# Patient Record
Sex: Male | Born: 1975 | Race: White | Hispanic: No | Marital: Married | Smoking: Never smoker
Health system: Southern US, Community
[De-identification: ages and names within clinical notes are randomized; demographics above are authoritative.]

## PROBLEM LIST (undated history)

## (undated) DIAGNOSIS — E079 Disorder of thyroid, unspecified: Secondary | ICD-10-CM

## (undated) DIAGNOSIS — B019 Varicella without complication: Secondary | ICD-10-CM

## (undated) DIAGNOSIS — G43909 Migraine, unspecified, not intractable, without status migrainosus: Secondary | ICD-10-CM

## (undated) DIAGNOSIS — R519 Headache, unspecified: Secondary | ICD-10-CM

## (undated) DIAGNOSIS — M109 Gout, unspecified: Secondary | ICD-10-CM

## (undated) DIAGNOSIS — I10 Essential (primary) hypertension: Secondary | ICD-10-CM

## (undated) DIAGNOSIS — T7840XA Allergy, unspecified, initial encounter: Secondary | ICD-10-CM

## (undated) DIAGNOSIS — M1990 Unspecified osteoarthritis, unspecified site: Secondary | ICD-10-CM

## (undated) HISTORY — DX: Headache, unspecified: R51.9

## (undated) HISTORY — DX: Allergy, unspecified, initial encounter: T78.40XA

## (undated) HISTORY — DX: Unspecified osteoarthritis, unspecified site: M19.90

## (undated) HISTORY — DX: Varicella without complication: B01.9

## (undated) HISTORY — DX: Essential (primary) hypertension: I10

## (undated) HISTORY — DX: Disorder of thyroid, unspecified: E07.9

## (undated) HISTORY — DX: Migraine, unspecified, not intractable, without status migrainosus: G43.909

---
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
## 2018-11-15 ENCOUNTER — Encounter: Payer: Self-pay | Admitting: Primary Care

## 2018-11-15 ENCOUNTER — Other Ambulatory Visit: Payer: Self-pay

## 2018-11-15 ENCOUNTER — Ambulatory Visit: Payer: PRIVATE HEALTH INSURANCE | Admitting: Primary Care

## 2018-11-15 ENCOUNTER — Other Ambulatory Visit: Payer: Self-pay | Admitting: Primary Care

## 2018-11-15 VITALS — BP 182/118 | HR 82 | Temp 98.4°F | Ht 66.75 in | Wt 282.8 lb

## 2018-11-15 DIAGNOSIS — I10 Essential (primary) hypertension: Secondary | ICD-10-CM

## 2018-11-15 DIAGNOSIS — M5136 Other intervertebral disc degeneration, lumbar region: Secondary | ICD-10-CM | POA: Diagnosis not present

## 2018-11-15 DIAGNOSIS — E039 Hypothyroidism, unspecified: Secondary | ICD-10-CM | POA: Diagnosis not present

## 2018-11-15 LAB — COMPREHENSIVE METABOLIC PANEL
ALT: 21 U/L (ref 0–53)
AST: 16 U/L (ref 0–37)
Albumin: 4.9 g/dL (ref 3.5–5.2)
Alkaline Phosphatase: 77 U/L (ref 39–117)
BUN: 13 mg/dL (ref 6–23)
CO2: 30 mEq/L (ref 19–32)
Calcium: 9.6 mg/dL (ref 8.4–10.5)
Chloride: 100 mEq/L (ref 96–112)
Creatinine, Ser: 0.8 mg/dL (ref 0.40–1.50)
GFR: 105.96 mL/min (ref >60.00)
Glucose, Bld: 92 mg/dL (ref 70–99)
Potassium: 4.1 mEq/L (ref 3.5–5.1)
Sodium: 139 mEq/L (ref 135–145)
Total Bilirubin: 0.5 mg/dL (ref 0.2–1.2)
Total Protein: 7.3 g/dL (ref 6.0–8.3)

## 2018-11-15 LAB — HEMOGLOBIN A1C: Hgb A1c MFr Bld: 5.8 % (ref 4.6–6.5)

## 2018-11-15 LAB — TSH: TSH: 10.31 u[IU]/mL — ABNORMAL HIGH (ref 0.35–4.50)

## 2018-11-15 LAB — T4, FREE: Free T4: 0.6 ng/dL (ref 0.60–1.60)

## 2018-11-15 MED ORDER — LEVOTHYROXINE SODIUM 50 MCG PO TABS: ORAL_TABLET | ORAL | 1 refills | Status: DC

## 2018-11-15 MED ORDER — LISINOPRIL-HYDROCHLOROTHIAZIDE 20-12.5 MG PO TABS: 1.0000 | ORAL_TABLET | Freq: Every day | ORAL | 0 refills | Status: DC

## 2018-11-15 NOTE — Patient Instructions (Signed)
Start lisinopril-hydrochlorothiazide 20-12.5 mg once daily for blood pressure.   Stop by the lab prior to leaving today. I will notify you of your results once received.   Start monitoring your blood pressure daily, around the same time of day, for the next 2-3 weeks.  Ensure that you have rested for 30 minutes prior to checking your blood pressure. Record your readings and bring them to your next visit.  Schedule a follow up visit with me for either August 31st and September 1st.  It was a pleasure to meet you today! Please don't hesitate to call or message me with any questions. Welcome to [facility]!   Hypertension, Adult High blood pressure (hypertension) is when the force of blood pumping through the arteries is too strong. The arteries are the blood vessels that carry blood from the heart throughout the body. Hypertension forces the heart to work harder to pump blood and may cause arteries to become narrow or stiff. Untreated or uncontrolled hypertension can cause a heart attack, heart failure, a stroke, kidney disease, and other problems. A blood pressure reading consists of a higher number over a lower number. Ideally, your blood pressure should be below 120/80. The first ("top") number is called the systolic pressure. It is a measure of the pressure in your arteries as your heart beats. The second ("bottom") number is called the diastolic pressure. It is a measure of the pressure in your arteries as the heart relaxes. What are the causes? The exact cause of this condition is not known. There are some conditions that result in or are related to high blood pressure. What increases the risk? Some risk factors for high blood pressure are under your control. The following factors may make you more likely to develop this condition:  Smoking.  Having type 2 diabetes mellitus, high cholesterol, or both.  Not getting enough exercise or physical activity.  Being overweight.  Having too much  fat, sugar, calories, or salt (sodium) in your diet.  Drinking too much alcohol. Some risk factors for high blood pressure may be difficult or impossible to change. Some of these factors include:  Having chronic kidney disease.  Having a family history of high blood pressure.  Age. Risk increases with age.  Race. You may be at higher risk if you are African American.  Gender. Men are at higher risk than women before age 42. After age 42, women are at higher risk than men.  Having obstructive sleep apnea.  Stress. What are the signs or symptoms? High blood pressure may not cause symptoms. Very high blood pressure (hypertensive crisis) may cause:  Headache.  Anxiety.  Shortness of breath.  Nosebleed.  Nausea and vomiting.  Vision changes.  Severe chest pain.  Seizures. How is this diagnosed? This condition is diagnosed by measuring your blood pressure while you are seated, with your arm resting on a flat surface, your legs uncrossed, and your feet flat on the floor. The cuff of the blood pressure monitor will be placed directly against the skin of your upper arm at the level of your heart. It should be measured at least twice using the same arm. Certain conditions can cause a difference in blood pressure between your right and left arms. Certain factors can cause blood pressure readings to be lower or higher than normal for a short period of time:  When your blood pressure is higher when you are in a health care provider's office than when you are at home, this is  called white coat hypertension. Most people with this condition do not need medicines.  When your blood pressure is higher at home than when you are in a health care provider's office, this is called masked hypertension. Most people with this condition may need medicines to control blood pressure. If you have a high blood pressure reading during one visit or you have normal blood pressure with other risk factors,  you may be asked to:  Return on a different day to have your blood pressure checked again.  Monitor your blood pressure at home for 1 week or longer. If you are diagnosed with hypertension, you may have other blood or imaging tests to help your health care provider understand your overall risk for other conditions. How is this treated? This condition is treated by making healthy lifestyle changes, such as eating healthy foods, exercising more, and reducing your alcohol intake. Your health care provider may prescribe medicine if lifestyle changes are not enough to get your blood pressure under control, and if:  Your systolic blood pressure is above 130.  Your diastolic blood pressure is above 80. Your personal target blood pressure may vary depending on your medical conditions, your age, and other factors. Follow these instructions at home: Eating and drinking   Eat a diet that is high in fiber and potassium, and low in sodium, added sugar, and fat. An example eating plan is called the DASH (Dietary Approaches to Stop Hypertension) diet. To eat this way: ? Eat plenty of fresh fruits and vegetables. Try to fill one half of your plate at each meal with fruits and vegetables. ? Eat whole grains, such as whole-wheat pasta, brown rice, or whole-grain bread. Fill about one fourth of your plate with whole grains. ? Eat or drink low-fat dairy products, such as skim milk or low-fat yogurt. ? Avoid fatty cuts of meat, processed or cured meats, and poultry with skin. Fill about one fourth of your plate with lean proteins, such as fish, chicken without skin, beans, eggs, or tofu. ? Avoid pre-made and processed foods. These tend to be higher in sodium, added sugar, and fat.  Reduce your daily sodium intake. Most people with hypertension should eat less than 1,500 mg of sodium a day.  Do not drink alcohol if: ? Your health care provider tells you not to drink. ? You are pregnant, may be pregnant, or are  planning to become pregnant.  If you drink alcohol: ? Limit how much you use to:  0-1 drink a day for women.  0-2 drinks a day for men. ? Be aware of how much alcohol is in your drink. In the [place] one drink equals one 12 oz bottle of beer (355 mL), one 5 oz glass of wine (148 mL), or one 1 oz glass of hard liquor (44 mL). Lifestyle   Work with your health care provider to maintain a healthy body weight or to lose weight. Ask what an ideal weight is for you.  Get at least 30 minutes of exercise most days of the week. Activities may include walking, swimming, or biking.  Include exercise to strengthen your muscles (resistance exercise), such as Pilates or lifting weights, as part of your weekly exercise routine. Try to do these types of exercises for 30 minutes at least 3 days a week.  Do not use any products that contain nicotine or tobacco, such as cigarettes, e-cigarettes, and chewing tobacco. If you need help quitting, ask your health care provider.  Monitor your  blood pressure at home as told by your health care provider.  Keep all follow-up visits as told by your health care provider. This is important. Medicines  Take over-the-counter and prescription medicines only as told by your health care provider. Follow directions carefully. Blood pressure medicines must be taken as prescribed.  Do not skip doses of blood pressure medicine. Doing this puts you at risk for problems and can make the medicine less effective.  Ask your health care provider about side effects or reactions to medicines that you should watch for. Contact a health care provider if you:  Think you are having a reaction to a medicine you are taking.  Have headaches that keep coming back (recurring).  Feel dizzy.  Have swelling in your ankles.  Have trouble with your vision. Get help right away if you:  Develop a severe headache or confusion.  Have unusual weakness or numbness.  Feel faint.  Have  severe pain in your chest or abdomen.  Vomit repeatedly.  Have trouble breathing. Summary  Hypertension is when the force of blood pumping through your arteries is too strong. If this condition is not controlled, it may put you at risk for serious complications.  Your personal target blood pressure may vary depending on your medical conditions, your age, and other factors. For most people, a normal blood pressure is less than 120/80.  Hypertension is treated with lifestyle changes, medicines, or a combination of both. Lifestyle changes include losing weight, eating a healthy, low-sodium diet, exercising more, and limiting alcohol. This information is not intended to replace advice given to you by your health care provider. Make sure you discuss any questions you have with your health care provider. Document Released: 03/15/2005 Document Revised: 11/23/2017 Document Reviewed: 11/23/2017 Elsevier Patient Education  2020 [employer]

## 2018-11-15 NOTE — Assessment & Plan Note (Signed)
Diagnosed years ago, has been off treatment for numerous years.  TSH today of 10, free T4 of 0.60. Prescription for levothyroxine 50 mcg sent to pharmacy.  Discussed proper instructions for taking levothyroxine.  Repeat labs in 6 weeks.

## 2018-11-15 NOTE — Assessment & Plan Note (Signed)
Significantly above goal on today's visit, also during visit with neurosurgery last week.  Prescription for lisinopril-chlorothiazide 20-12.5mg  sent to pharmacy.  We will have him monitor his readings at home, we will also plan to see him back in 2 weeks for follow-up and BMP.

## 2018-11-15 NOTE — Progress Notes (Signed)
Subjective:    Patient ID: Peter Fitzgerald, male    DOB: [birth year], 43 y.o.   MRN: [id number]  HPI  Peter Fitzgerald is a 43 year old male who presents today to establish care and discuss the problems mentioned below. Will obtain/review records.  1) Essential Hypertension: Sent to our office from Peter Fitzgerald for uncontrolled blood pressure with a reading of 173/122 during a recent visit on 11/08/18. He's not seen a PCP since 2013, endorses hypertension diagnosis at that time and was treated with medication and does not recall  He denies headaches, dizziness, chest pain.   BP Readings from Last 3 Encounters:  11/15/18 (!) 182/118   2) Thyroid Problems: Diagnosed about 10+ years ago, once managed on levothyroxine and his last dose was 150 mcg. He has been off of levothyroxine for several years.  No recent thyroid check.  He denies extreme fatigue, cold intolerance, constipation, brittle nails.   3) Migraines: Chronic for years, nearly resolved now. Last migraine was 2-3 weeks ago, prior to then was months ago. Previously managed on Relpax for which was used as needed.   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  Recent MRI with significant degenerative disc disease, nerve root compression.  Plan is for microdiscectomy and laminectomy per Peter Fitzgerald.  Prior to surgery he must gain better control over uncontrolled hypertension.  Tentatively he is looking at surgery for 6 to 8 weeks from now.  Review of Systems  Constitutional: Negative for fatigue.  Eyes: Negative for visual disturbance.  Respiratory: Negative for shortness of breath.   Cardiovascular: Negative for chest pain and palpitations.  Endocrine: Negative for cold intolerance.  Musculoskeletal: Positive for back pain.  Neurological: Negative for dizziness and headaches.       Past Medical History:  Diagnosis Date  . Allergy   . Arthritis   . Chickenpox   . Frequent headaches   . Hypertension   .  Migraines   . Thyroid disease      Social History   Socioeconomic History  . Marital status: Married    Spouse name: Not on file  . Number of children: Not on file  . Years of education: Not on file  . Highest education level: Not on file  Occupational History  . Not on file  Social Needs  . Financial resource strain: Not on file  . Food insecurity    Worry: Not on file    Inability: Not on file  . Transportation needs    Medical: Not on file    Non-medical: Not on file  Tobacco Use  . Smoking status: Never Smoker  . Smokeless tobacco: Never Used  Substance and Sexual Activity  . Alcohol use: Not Currently  . Drug use: Not on file  . Sexual activity: Not on file  Lifestyle  . Physical activity    Days per week: Not on file    Minutes per session: Not on file  . Stress: Not on file  Relationships  . Social Herbalist on phone: Not on file    Gets together: Not on file    Attends religious service: Not on file    Active member of club or organization: Not on file    Attends meetings of clubs or organizations: Not on file    Relationship status: Not on file  . Intimate partner violence    Fear of current or ex partner: Not on file    Emotionally abused:  Not on file    Physically abused: Not on file    Forced sexual activity: Not on file  Other Topics Concern  . Not on file  Social History Narrative   Married.   One child.   Works as a Engineer, [employer].    Past Surgical History:  Procedure Laterality Date  . MINOR CARPAL TUNNEL  1997    Family History  Problem Relation Age of Onset  . Alcohol abuse Mother   . Arthritis Mother   . COPD Mother   . Drug abuse Mother   . Early death Mother        untreated COPD, CKD  . Kidney disease Mother        On dialysis   . Learning disabilities Mother   . Alcohol abuse Father   . Drug abuse Father   . Early death Father        Drowning    No Known Allergies  Current Outpatient Medications on File Prior to  Visit  Medication Sig Dispense Refill  . cetirizine (ZYRTEC) 10 MG tablet Take 10 mg by mouth daily.    Marland Kitchen. ibuprofen (ADVIL) 800 MG tablet Take 800 mg by mouth 2 (two) times daily.     No current facility-administered medications on file prior to visit.     BP (!) 182/118   Pulse 82   Temp 98.4 F (36.9 C) (Temporal)   Ht 5' 6.75" (1.695 m)   Wt 282 lb 12 oz (128.3 kg)   SpO2 97%   BMI 44.62 kg/m    Objective:   Physical Exam  Constitutional: He appears well-nourished.  Neck: Neck supple.  Cardiovascular: Normal rate and regular rhythm.  Respiratory: Effort normal and breath sounds normal.  Musculoskeletal:     Comments: Patient standing during visit due to chronic back pain.  Skin: Skin is warm and dry.  Psychiatric: He has a normal mood and affect.           Assessment & Plan:

## 2018-11-15 NOTE — Assessment & Plan Note (Signed)
Following with neurosurgery, plans for surgery within the next 6 to 8 weeks.  Will gain control of blood pressure so that he can be cleared for surgery.

## 2018-11-27 ENCOUNTER — Encounter: Payer: Self-pay | Admitting: Primary Care

## 2018-11-27 ENCOUNTER — Other Ambulatory Visit: Payer: Self-pay

## 2018-11-27 ENCOUNTER — Ambulatory Visit: Payer: PRIVATE HEALTH INSURANCE | Admitting: Primary Care

## 2018-11-27 VITALS — BP 122/82 | HR 78 | Temp 97.9°F | Ht 66.75 in | Wt 278.5 lb

## 2018-11-27 DIAGNOSIS — I10 Essential (primary) hypertension: Secondary | ICD-10-CM

## 2018-11-27 DIAGNOSIS — E039 Hypothyroidism, unspecified: Secondary | ICD-10-CM

## 2018-11-27 LAB — BASIC METABOLIC PANEL
BUN: 20 mg/dL (ref 6–23)
CO2: 30 mEq/L (ref 19–32)
Calcium: 9.5 mg/dL (ref 8.4–10.5)
Chloride: 98 mEq/L (ref 96–112)
Creatinine, Ser: 0.97 mg/dL (ref 0.40–1.50)
GFR: 84.82 mL/min (ref >60.00)
Glucose, Bld: 98 mg/dL (ref 70–99)
Potassium: 4.1 mEq/L (ref 3.5–5.1)
Sodium: 137 mEq/L (ref 135–145)

## 2018-11-27 MED ORDER — LISINOPRIL-HYDROCHLOROTHIAZIDE 20-12.5 MG PO TABS: 1.0000 | ORAL_TABLET | Freq: Every day | ORAL | 3 refills | Status: DC

## 2018-11-27 NOTE — Patient Instructions (Signed)
Stop by the lab prior to leaving today. I will notify you of your results once received.   Continue lisinopril-HCTZ 20-12.5 mg once daily for blood pressure.  Be sure to take your levothyroxine (thyroid medication) every morning on an empty stomach with water only. No food or other medications for 30 minutes. No heartburn medication, iron pills, calcium, vitamin D, or magnesium pills within four hours of taking levothyroxine.   Schedule a lab only appointment for 4 weeks to repeat thyroid testing.   It was a pleasure to see you today!

## 2018-11-27 NOTE — Progress Notes (Signed)
Subjective:    Patient ID: Peter Fitzgerald, male    DOB: 02/02/1976, 43 y.o.   MRN: [id number]  HPI  Peter Fitzgerald is a 43 year old male who presents today for follow up of hypertension and hypothyroidism.  1) Essential Hypertension: He was last evaluated as a new patient on 11/15/18, sent by neurosurgery due to uncontrolled hypertension. He is pending surgery for degenerative disc disease to lumbar spine. Given elevated readings he was initiated on lisinopril-HCTZ 20-12.5 mg and asked to return two weeks later for BP check.  BP Readings from Last 3 Encounters:  11/27/18 122/82  11/15/18 (!) 182/118   Since his last visit he's checking his BP at home and is getting readings of 150-170's/90's-low 100's. He has an older BP cuff and thinks his cuff is faulty. He had a mild cough initially which has since improved. He denies headaches, dizziness.   2) Hypothyroidism: Diagnosed years ago. During his last visit as a new patient he endorsed being off of thyroid treatment for numerous years. TSH of 10 and Free T4 of 0.60 that day so he was initiated on levothyroxine 50 mcg.   Since his last visit he is taking his levothyroxine every morning with water only, no food or other medications for at least 30 minutes.   Review of Systems  Respiratory: Negative for cough and shortness of breath.   Cardiovascular: Negative for chest pain.  Neurological: Negative for dizziness and headaches.       Past Medical History:  Diagnosis Date  . Allergy   . Arthritis   . Chickenpox   . Frequent headaches   . Hypertension   . Migraines   . Thyroid disease      Social History   Socioeconomic History  . Marital status: Married    Spouse name: Not on file  . Number of children: Not on file  . Years of education: Not on file  . Highest education level: Not on file  Occupational History  . Not on file  Social Needs  . Financial resource strain: Not on file  . Food insecurity    Worry: Not on file   Inability: Not on file  . Transportation needs    Medical: Not on file    Non-medical: Not on file  Tobacco Use  . Smoking status: Never Smoker  . Smokeless tobacco: Never Used  Substance and Sexual Activity  . Alcohol use: Not Currently  . Drug use: Not on file  . Sexual activity: Not on file  Lifestyle  . Physical activity    Days per week: Not on file    Minutes per session: Not on file  . Stress: Not on file  Relationships  . Social Herbalist on phone: Not on file    Gets together: Not on file    Attends religious service: Not on file    Active member of club or organization: Not on file    Attends meetings of clubs or organizations: Not on file    Relationship status: Not on file  . Intimate partner violence    Fear of current or ex partner: Not on file    Emotionally abused: Not on file    Physically abused: Not on file    Forced sexual activity: Not on file  Other Topics Concern  . Not on file  Social History Narrative   Married.   One child.   Works as a Child psychotherapist.    Past Surgical  History:  Procedure Laterality Date  . MINOR CARPAL TUNNEL  1997    Family History  Problem Relation Age of Onset  . Alcohol abuse Mother   . Arthritis Mother   . COPD Mother   . Drug abuse Mother   . Early death Mother        untreated COPD, CKD  . Kidney disease Mother        On dialysis   . Learning disabilities Mother   . Alcohol abuse Father   . Drug abuse Father   . Early death Father        Drowning    No Known Allergies  Current Outpatient Medications on File Prior to Visit  Medication Sig Dispense Refill  . cetirizine (ZYRTEC) 10 MG tablet Take 10 mg by mouth daily.    Marland Kitchen. ibuprofen (ADVIL) 800 MG tablet Take 800 mg by mouth 2 (two) times daily.    Marland Kitchen. levothyroxine (SYNTHROID) 50 MCG tablet Take 1 tablet by mouth every morning on an empty stomach with water only.  No food or other medications for 30 minutes. 30 tablet 1  . lisinopril-hydrochlorothiazide  (ZESTORETIC) 20-12.5 MG tablet Take 1 tablet by mouth daily. For blood pressure. 30 tablet 0   No current facility-administered medications on file prior to visit.     BP 122/82   Pulse 78   Temp 97.9 F (36.6 C) (Oral)   Ht 5' 6.75" (1.695 m)   Wt 278 lb 8 oz (126.3 kg)   SpO2 97%   BMI 43.95 kg/m    Objective:   Physical Exam  Constitutional: He appears well-nourished.  Neck: Neck supple.  Cardiovascular: Normal rate and regular rhythm.  Respiratory: Effort normal and breath sounds normal.  Skin: Skin is warm and dry.  Psychiatric: He has a normal mood and affect.           Assessment & Plan:

## 2018-11-27 NOTE — Assessment & Plan Note (Signed)
Improved with prescribed regimen in our office, home readings are higher but I question the accuracy of his cuff. Manual reading in our office proves to be controlled.   BMP unremarkable, continue current regimen. Refills sent to pharmacy.

## 2018-11-27 NOTE — Assessment & Plan Note (Signed)
Taking levothyroxine appropriately.  Repeat TSH in 4 weeks.

## 2018-12-18 ENCOUNTER — Other Ambulatory Visit: Payer: Self-pay | Admitting: Primary Care

## 2018-12-18 DIAGNOSIS — E039 Hypothyroidism, unspecified: Secondary | ICD-10-CM

## 2018-12-25 ENCOUNTER — Other Ambulatory Visit (INDEPENDENT_AMBULATORY_CARE_PROVIDER_SITE_OTHER): Payer: PRIVATE HEALTH INSURANCE

## 2018-12-25 ENCOUNTER — Other Ambulatory Visit: Payer: Self-pay

## 2018-12-25 DIAGNOSIS — E039 Hypothyroidism, unspecified: Secondary | ICD-10-CM | POA: Diagnosis not present

## 2018-12-25 LAB — TSH: TSH: 3.63 u[IU]/mL (ref 0.35–4.50)

## 2018-12-28 HISTORY — PX: LAMINECTOMY AND MICRODISCECTOMY LUMBAR SPINE: SHX1913

## 2019-01-10 ENCOUNTER — Other Ambulatory Visit: Payer: Self-pay

## 2019-01-10 DIAGNOSIS — Z20822 Contact with and (suspected) exposure to covid-19: Secondary | ICD-10-CM

## 2019-01-12 LAB — NOVEL CORONAVIRUS, NAA: SARS-CoV-2, NAA: NOT DETECTED

## 2019-01-18 ENCOUNTER — Other Ambulatory Visit: Payer: Self-pay | Admitting: Primary Care

## 2019-01-18 DIAGNOSIS — E039 Hypothyroidism, unspecified: Secondary | ICD-10-CM

## 2019-01-19 ENCOUNTER — Ambulatory Visit (INDEPENDENT_AMBULATORY_CARE_PROVIDER_SITE_OTHER): Payer: PRIVATE HEALTH INSURANCE | Admitting: Primary Care

## 2019-01-19 DIAGNOSIS — R05 Cough: Secondary | ICD-10-CM | POA: Diagnosis not present

## 2019-01-19 DIAGNOSIS — R059 Cough, unspecified: Secondary | ICD-10-CM

## 2019-01-19 DIAGNOSIS — J302 Other seasonal allergic rhinitis: Secondary | ICD-10-CM

## 2019-01-19 MED ORDER — OMEPRAZOLE 20 MG PO CPDR: 20.0000 mg | DELAYED_RELEASE_CAPSULE | Freq: Every evening | ORAL | 0 refills | Status: DC

## 2019-01-19 MED ORDER — LEVOCETIRIZINE DIHYDROCHLORIDE 5 MG PO TABS: 5.0000 mg | ORAL_TABLET | Freq: Every evening | ORAL | 0 refills | Status: DC

## 2019-01-19 NOTE — Patient Instructions (Signed)
Stop Zyrtec. Start levocetirizine (Xyzal) for allergies. Take at bedtime.  Start omeprazole 20 mg at bedtime in a few days if no improvement.  The endotracheal tube from intubation during your surgery could have caused aggravation, this should improve overtime.   Please update me in one week. Call me if you develop fevers, chills, fatigue, start feeling bad.  It was a pleasure to see you today! Allie Bossier, NP-C

## 2019-01-19 NOTE — Progress Notes (Signed)
Subjective:    Patient ID: Peter Fitzgerald, male    DOB: 1975/11/06, 42 y.o.   MRN: [id number]  HPI  Virtual Visit via Video Note  I connected with Peter Fitzgerald on 01/19/19 at  8:40 AM EDT by a video enabled telemedicine application and verified that I am speaking with the correct person using two identifiers.  Location: Patient: Home Provider: Office   I discussed the limitations of evaluation and management by telemedicine and the availability of in person appointments. The patient expressed understanding and agreed to proceed.  History of Present Illness:  Peter Fitzgerald is a 43 year old male with a history of hypertension managed on ACE, hypothyroidism who presents today with a chief complaint of cough.  He also has a scratchy throat and headache at times. His cough began a few days prior to his back surgery on 01/06/19.Marland Kitchen He initially thought his cough was related to season allergies as he does have post nasal drip. He was tested for Covid-19 on 01/10/19 which was negative. He was compliant to his incentive spirometer as directed.   He denies fevers, rhinorrhea, nasal congestion, sinus pressure, esophageal burning, fatigue. He feels great overall. Cough is worse in the afternoon/evening, not bothersome during sleep but will wake at night noticing post nasal drip.  He is taking Zyrtec daily.    Observations/Objective:  Alert and oriented. Appears well, not sickly. No distress. Speaking in complete sentences. No cough during visit.  Assessment and Plan:  Acute cough x 2 weeks, feels well. Differentials include ACE induced cough, silent reflux aggravated by post nasal drip, allergy induced from post nasal drip. He appears well and feels well so I doubt viral/bacterial involvement. Negative Covid-19 test on 01/10/19. Cough began prior to surgery but endotracheal tube could have caused irritation.   Will have him switch from Zyrtec to [place] in omeprazole 20 mg nightly after a few  days if no improvement. He will update in one week. Consider holding ACE if needed.  Follow Up Instructions:  Stop Zyrtec. Start levocetirizine (Xyzal) for allergies. Take at bedtime.  Start omeprazole 20 mg at bedtime in a few days if no improvement.  The endotracheal tube from intubation during your surgery could have caused aggravation, this should improve overtime.   Please update me in one week. Call me if you develop fevers, chills, fatigue, start feeling bad.  It was a pleasure to see you today! Allie Bossier, NP-C    I discussed the assessment and treatment plan with the patient. The patient was provided an opportunity to ask questions and all were answered. The patient agreed with the plan and demonstrated an understanding of the instructions.   The patient was advised to call back or seek an in-person evaluation if the symptoms worsen or if the condition fails to improve as anticipated.   Pleas Koch, NP    Review of Systems  Constitutional: Negative for chills, fatigue and fever.  HENT: Positive for postnasal drip and sore throat. Negative for congestion, ear pain, rhinorrhea and sinus pressure.   Respiratory: Positive for cough. Negative for shortness of breath.   Cardiovascular: Negative for chest pain.  Allergic/Immunologic: Positive for environmental allergies.       Past Medical History:  Diagnosis Date  . Allergy   . Arthritis   . Chickenpox   . Frequent headaches   . Hypertension   . Migraines   . Thyroid disease      Social History   Socioeconomic History  .  Marital status: Married    Spouse name: Not on file  . Number of children: Not on file  . Years of education: Not on file  . Highest education level: Not on file  Occupational History  . Not on file  Social Needs  . Financial resource strain: Not on file  . Food insecurity    Worry: Not on file    Inability: Not on file  . Transportation needs    Medical: Not on file     Non-medical: Not on file  Tobacco Use  . Smoking status: Never Smoker  . Smokeless tobacco: Never Used  Substance and Sexual Activity  . Alcohol use: Not Currently  . Drug use: Not on file  . Sexual activity: Not on file  Lifestyle  . Physical activity    Days per week: Not on file    Minutes per session: Not on file  . Stress: Not on file  Relationships  . Social Musician on phone: Not on file    Gets together: Not on file    Attends religious service: Not on file    Active member of club or organization: Not on file    Attends meetings of clubs or organizations: Not on file    Relationship status: Not on file  . Intimate partner violence    Fear of current or ex partner: Not on file    Emotionally abused: Not on file    Physically abused: Not on file    Forced sexual activity: Not on file  Other Topics Concern  . Not on file  Social History Narrative   Married.   One child.   Works as a Engineer, production.    Past Surgical History:  Procedure Laterality Date  . MINOR CARPAL TUNNEL  1997    Family History  Problem Relation Age of Onset  . Alcohol abuse Mother   . Arthritis Mother   . COPD Mother   . Drug abuse Mother   . Early death Mother        untreated COPD, CKD  . Kidney disease Mother        On dialysis   . Learning disabilities Mother   . Alcohol abuse Father   . Drug abuse Father   . Early death Father        Drowning    No Known Allergies  Current Outpatient Medications on File Prior to Visit  Medication Sig Dispense Refill  . cetirizine (ZYRTEC) 10 MG tablet Take 10 mg by mouth daily.    Marland Kitchen ibuprofen (ADVIL) 800 MG tablet Take 800 mg by mouth 2 (two) times daily.    Marland Kitchen levothyroxine (SYNTHROID) 50 MCG tablet TAKE 1 TABLET BY MOUTH EVERY MORNING ON AN EMPTY STOMACH WITH WATER ONLY. NO FOOD/MEDS FOR 30 MINS 90 tablet 1  . lisinopril-hydrochlorothiazide (ZESTORETIC) 20-12.5 MG tablet Take 1 tablet by mouth daily. For blood pressure. 90 tablet 3    No current facility-administered medications on file prior to visit.     There were no vitals taken for this visit.   Objective:   Physical Exam  Constitutional: He is oriented to person, place, and time. He appears well-nourished.  Respiratory: Effort normal. No respiratory distress.  No cough during exam  Neurological: He is alert and oriented to person, place, and time.           Assessment & Plan:

## 2019-03-11 ENCOUNTER — Other Ambulatory Visit: Payer: Self-pay | Admitting: Primary Care

## 2019-03-11 DIAGNOSIS — R05 Cough: Secondary | ICD-10-CM

## 2019-03-11 DIAGNOSIS — R059 Cough, unspecified: Secondary | ICD-10-CM

## 2019-03-14 NOTE — Telephone Encounter (Signed)
See my chart message

## 2019-03-14 NOTE — Telephone Encounter (Signed)
Last prescribed on 01/19/2019 . Last appointment on 01/19/2019. No future appointment

## 2019-04-23 ENCOUNTER — Ambulatory Visit: Payer: PRIVATE HEALTH INSURANCE | Attending: Internal Medicine

## 2019-04-23 ENCOUNTER — Other Ambulatory Visit: Payer: PRIVATE HEALTH INSURANCE

## 2019-04-23 DIAGNOSIS — Z20822 Contact with and (suspected) exposure to covid-19: Secondary | ICD-10-CM

## 2019-04-24 LAB — NOVEL CORONAVIRUS, NAA: SARS-CoV-2, NAA: DETECTED — AB

## 2019-04-25 ENCOUNTER — Encounter: Payer: Self-pay | Admitting: Nurse Practitioner

## 2019-04-25 ENCOUNTER — Telehealth (HOSPITAL_COMMUNITY): Payer: Self-pay | Admitting: Nurse Practitioner

## 2019-04-25 NOTE — Telephone Encounter (Signed)
Called to Discuss with patient about Covid symptoms and the use of bamlanivimab, a monoclonal antibody infusion for those with mild to moderate Covid symptoms and at a high risk of hospitalization.     Pt is qualified for this infusion at the [facility] infusion center due to co-morbid conditions and/or a member of an at-risk group.     Unable to reach pt. Sent [employer]. Left phone message.

## 2019-06-07 ENCOUNTER — Ambulatory Visit (INDEPENDENT_AMBULATORY_CARE_PROVIDER_SITE_OTHER): Payer: PRIVATE HEALTH INSURANCE | Admitting: Primary Care

## 2019-06-07 ENCOUNTER — Other Ambulatory Visit: Payer: Self-pay

## 2019-06-07 ENCOUNTER — Ambulatory Visit (INDEPENDENT_AMBULATORY_CARE_PROVIDER_SITE_OTHER)
Admission: RE | Admit: 2019-06-07 | Discharge: 2019-06-07 | Disposition: A | Discharge status: Home / Self Care | Payer: PRIVATE HEALTH INSURANCE | Source: Ambulatory Visit | Attending: Primary Care | Admitting: Primary Care

## 2019-06-07 VITALS — BP 122/80 | HR 82 | Temp 97.2°F | Ht 66.75 in | Wt 280.8 lb

## 2019-06-07 DIAGNOSIS — I10 Essential (primary) hypertension: Secondary | ICD-10-CM

## 2019-06-07 DIAGNOSIS — G8929 Other chronic pain: Secondary | ICD-10-CM

## 2019-06-07 DIAGNOSIS — M546 Pain in thoracic spine: Secondary | ICD-10-CM | POA: Diagnosis not present

## 2019-06-07 MED ORDER — LOSARTAN POTASSIUM-HCTZ 50-12.5 MG PO TABS: 1.0000 | ORAL_TABLET | Freq: Every day | ORAL | 0 refills | Status: DC

## 2019-06-07 NOTE — Assessment & Plan Note (Signed)
Well controlled on current regimen, does seem to have ACE-I induced cough.  Stop lisinopril-HCTZ. Start losartan-HCTZ.  He will monitor BP at home and will update in 2 weeks via My Chart.

## 2019-06-07 NOTE — Patient Instructions (Addendum)
Stop taking lisinopril-HCTZ for blood pressure.  Start taking losartan-HCTZ for blood pressure.  Please update me in 2 weeks with blood pressure readings and your cough.  Complete xray(s) prior to leaving today. I will notify you of your results once received.  It was a pleasure to see you today!

## 2019-06-07 NOTE — Assessment & Plan Note (Signed)
Acute on chronic.  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   No alarm signs.   Check plain films today, consider PT. He will also discuss this with his neurosurgeon tomorrow.

## 2019-06-07 NOTE — Progress Notes (Signed)
Subjective:    Patient ID: Peter Fitzgerald, male    DOB: 19-Oct-1975, 44 y.o.   MRN: [id number]  HPI  This visit occurred during the SARS-CoV-2 public health emergency.  Safety protocols were in place, including screening questions prior to the visit, additional usage of staff PPE, and extensive cleaning of exam room while observing appropriate contact time as indicated for disinfecting solutions.   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  He has noticed a day and evening cough, mostly when laying down at night. Will have coughing spells, tickle/scractchy throat sensation. Currently managed on lisinopril-HCTZ. Cough began several months ago.   Chronic for years, located to the mid lower thoracic spinal region, occasional radiation over to right lateral and anterior chest. Several weeks ago he noticed an increase in his discomfort, felt like muscle spasms. He describes his pain as tightness, sharp, stabbing, "grinding" pain. Pain is constant, has difficulty sleeping at night due to pain.   He denies numbness to the thoracic back, injury/trauma. 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 He's not been taking anything OTC for symptoms.   He is having left lower extremity numbness and burning down the left lower extremity, plans on seeing his neurosurgeon tomorrow. Underwent lumbar spinal surgery last year.   BP Readings from Last 3 Encounters:  06/07/19 122/80  11/27/18 122/82  11/15/18 (!) 182/118     Review of Systems  HENT: Negative for congestion.   Respiratory: Positive for cough. Negative for shortness of breath.   Cardiovascular: Negative for chest pain.  Musculoskeletal: Positive for back pain.  Neurological: Positive for numbness.       Past Medical History:  Diagnosis Date  . Allergy   . Arthritis   . Chickenpox    . Frequent headaches   . Hypertension   . Migraines   . Thyroid disease      Social History   Socioeconomic History  . Marital status: Married    Spouse name: Not on file  . Number of children: Not on file  . Years of education: Not on file  . Highest education level: Not on file  Occupational History  . Not on file  Tobacco Use  . Smoking status: Never Smoker  . Smokeless tobacco: Never Used  Substance and Sexual Activity  . Alcohol use: Not Currently  . Drug use: Not on file  . Sexual activity: Not on file  Other Topics Concern  . Not on file  Social History Narrative   Married.   One child.   Works as a Engineer, production.   Social Determinants of Health   Financial Resource Strain:   . Difficulty of Paying Living Expenses:   Food Insecurity:   . Worried About Programme researcher, broadcasting/film/video in the Last Year:   . Barista in the Last Year:   Transportation Needs:   . Freight forwarder (Medical):   Marland Kitchen Lack of Transportation (Non-Medical):   Physical Activity:   . Days of Exercise per Week:   . Minutes of Exercise per Session:   Stress:   . Feeling of Stress :   Social Connections:   . Frequency of Communication with Friends and Family:   . Frequency of Social Gatherings with Friends and Family:   . Attends Religious  Services:   . Active Member of Clubs or Organizations:   . Attends Archivist Meetings:   Marland Kitchen Marital Status:   Intimate Partner Violence:   . Fear of Current or Ex-Partner:   . Emotionally Abused:   Marland Kitchen Physically Abused:   . Sexually Abused:     Past Surgical History:  Procedure Laterality Date  . MINOR CARPAL TUNNEL  1997    Family History  Problem Relation Age of Onset  . Alcohol abuse Mother   . Arthritis Mother   . COPD Mother   . Drug abuse Mother   . Early death Mother        untreated COPD, CKD  . Kidney disease Mother        On dialysis   . Learning disabilities Mother   . Alcohol abuse Father   . Drug abuse Father   .  Early death Father        Drowning    No Known Allergies  Current Outpatient Medications on File Prior to Visit  Medication Sig Dispense Refill  . cetirizine (ZYRTEC) 10 MG tablet Take 10 mg by mouth daily.    Marland Kitchen ibuprofen (ADVIL) 800 MG tablet Take 800 mg by mouth 2 (two) times daily.    Marland Kitchen levothyroxine (SYNTHROID) 50 MCG tablet TAKE 1 TABLET BY MOUTH EVERY MORNING ON AN EMPTY STOMACH WITH WATER ONLY. NO FOOD/MEDS FOR 30 MINS 90 tablet 1   No current facility-administered medications on file prior to visit.    BP 122/80   Pulse 82   Temp (!) 97.2 F (36.2 C) (Temporal)   Ht 5' 6.75" (1.695 m)   Wt 280 lb 12 oz (127.3 kg)   SpO2 98%   BMI 44.30 kg/m    Objective:   Physical Exam  Constitutional: He appears well-nourished.  Cardiovascular: Normal rate and regular rhythm.  Respiratory: Effort normal and breath sounds normal.  Dry cough during exam  Musculoskeletal:     Thoracic back: Pain present. No tenderness. Normal range of motion.       Back:           Assessment & Plan:

## 2019-06-28 DIAGNOSIS — I10 Essential (primary) hypertension: Secondary | ICD-10-CM

## 2019-06-28 MED ORDER — LOSARTAN POTASSIUM-HCTZ 100-12.5 MG PO TABS: 1.0000 | ORAL_TABLET | Freq: Every day | ORAL | 0 refills | Status: DC

## 2019-06-29 ENCOUNTER — Other Ambulatory Visit: Payer: Self-pay | Admitting: Primary Care

## 2019-06-29 DIAGNOSIS — I10 Essential (primary) hypertension: Secondary | ICD-10-CM

## 2019-07-03 ENCOUNTER — Other Ambulatory Visit: Payer: Self-pay | Admitting: Primary Care

## 2019-07-03 DIAGNOSIS — E039 Hypothyroidism, unspecified: Secondary | ICD-10-CM

## 2019-07-11 ENCOUNTER — Other Ambulatory Visit: Payer: Self-pay | Admitting: Podiatry

## 2019-07-11 ENCOUNTER — Ambulatory Visit: Payer: PRIVATE HEALTH INSURANCE | Admitting: Podiatry

## 2019-07-11 ENCOUNTER — Encounter: Payer: Self-pay | Admitting: Podiatry

## 2019-07-11 ENCOUNTER — Ambulatory Visit (INDEPENDENT_AMBULATORY_CARE_PROVIDER_SITE_OTHER): Payer: PRIVATE HEALTH INSURANCE

## 2019-07-11 ENCOUNTER — Other Ambulatory Visit: Payer: Self-pay

## 2019-07-11 VITALS — Temp 98.2°F

## 2019-07-11 DIAGNOSIS — M2141 Flat foot [pes planus] (acquired), right foot: Secondary | ICD-10-CM

## 2019-07-11 DIAGNOSIS — M722 Plantar fascial fibromatosis: Secondary | ICD-10-CM

## 2019-07-11 DIAGNOSIS — M2142 Flat foot [pes planus] (acquired), left foot: Secondary | ICD-10-CM

## 2019-07-11 MED ORDER — MELOXICAM 15 MG PO TABS: 15.0000 mg | ORAL_TABLET | Freq: Every day | ORAL | 3 refills | Status: DC

## 2019-07-11 MED ORDER — METHYLPREDNISOLONE 4 MG PO TBPK: ORAL_TABLET | ORAL | 0 refills | Status: DC

## 2019-07-11 NOTE — Progress Notes (Signed)
Subjective:  Patient ID: Peter Fitzgerald, male    DOB: 1976-03-29,  MRN: [id number] HPI Chief Complaint  Patient presents with  . Foot Pain    Patient presents today for bilat feet/heel pain x 3-4 months.  He states "the pains come and go but throbs at times.  Its worse by the end of the day when Ive been on them alot.  The pains radiate into my ankles    44 y.o. male presents with the above complaint.   ROS: Denies fever chills nausea vomiting muscle aches pains calf pain back pain chest pain shortness of breath.  Past Medical History:  Diagnosis Date  . Allergy   . Arthritis   . Chickenpox   . Frequent headaches   . Hypertension   . Migraines   . Thyroid disease    Past Surgical History:  Procedure Laterality Date  . MINOR CARPAL TUNNEL  1997    Current Outpatient Medications:  .  cetirizine (ZYRTEC) 10 MG tablet, Take 10 mg by mouth daily., Disp: , Rfl:  .  gabapentin (NEURONTIN) 300 MG capsule, Take 300 mg by mouth at bedtime., Disp: , Rfl:  .  ibuprofen (ADVIL) 800 MG tablet, Take 800 mg by mouth 2 (two) times daily., Disp: , Rfl:  .  levothyroxine (SYNTHROID) 50 MCG tablet, TAKE 1 TABLET BY MOUTH EVERY MORNING ON AN EMPTY STOMACH WITH WATER ONLY. NO FOOD/MEDS FOR 30 MINS, Disp: 30 tablet, Rfl: 5 .  losartan-hydrochlorothiazide (HYZAAR) 100-12.5 MG tablet, Take 1 tablet by mouth daily. For blood pressure., Disp: 30 tablet, Rfl: 0 .  meloxicam (MOBIC) 15 MG tablet, Take 1 tablet (15 mg total) by mouth daily., Disp: 30 tablet, Rfl: 3 .  methylPREDNISolone (MEDROL DOSEPAK) 4 MG TBPK tablet, 6 day dose pack - take as directed, Disp: 21 tablet, Rfl: 0  No Known Allergies Review of Systems Objective:   Vitals:   07/11/19 0909  Temp: 98.2 F (36.8 C)    General: Well developed, nourished, in no acute distress, alert and oriented x3   Dermatological: Skin is warm, dry and supple bilateral. Nails x 10 are well maintained; remaining integument appears unremarkable at this time.  There are no open sores, no preulcerative lesions, no rash or signs of infection present.  Vascular: Dorsalis Pedis artery and Posterior Tibial artery pedal pulses are 2/4 bilateral with immedate capillary fill time. Pedal hair growth present. No varicosities and no lower extremity edema present bilateral.   Neruologic: Grossly intact via light touch bilateral. Vibratory intact via tuning fork bilateral. Protective threshold with Semmes Wienstein monofilament intact to all pedal sites bilateral. Patellar and Achilles deep tendon reflexes 2+ bilateral. No Babinski or clonus noted bilateral.   Musculoskeletal: No gross boney pedal deformities bilateral. No pain, crepitus, or limitation noted with foot and ankle range of motion bilateral. Muscular strength 5/5 in all groups tested bilateral.  Pain on palpation medial calcaneal tubercles bilateral heels.  Flexible pes planus is also noted not demonstrating any coalitions. Gait: Unassisted, Nonantalgic.    Radiographs:  Radiographs taken today demonstrate soft tissue increase in density at the plantar fascial calcaneal insertion sites bilaterally otherwise osseously mature individual with no acute findings.  Assessment & Plan:   Assessment: Plantar fasciitis bilateral pes planus bilateral.  Plan: Discussed etiology pathology conservative versus surgical therapies at this point we initiated cortisone injection 20 mg Kenalog 5 mg Marcaine point of maximal tenderness medial aspect of bilateral heel.  Start him on a Medrol Dosepak to be followed  by meloxicam.  Placed in the plantar fascial brace bilateral and a single night splint.  Discussed appropriate shoe gear stretching exercises ice therapy and shoe gear modification.  He will also follow-up with Ra[facility] Noble for orthotics.     Perle Gibbon T. [place], [place]

## 2019-07-17 ENCOUNTER — Other Ambulatory Visit: Payer: Self-pay

## 2019-07-17 ENCOUNTER — Encounter: Payer: Self-pay | Admitting: Primary Care

## 2019-07-17 ENCOUNTER — Ambulatory Visit: Payer: PRIVATE HEALTH INSURANCE | Admitting: Primary Care

## 2019-07-17 VITALS — BP 134/76 | HR 92 | Temp 96.8°F | Ht 66.75 in | Wt 292.8 lb

## 2019-07-17 DIAGNOSIS — I10 Essential (primary) hypertension: Secondary | ICD-10-CM

## 2019-07-17 DIAGNOSIS — R7303 Prediabetes: Secondary | ICD-10-CM | POA: Diagnosis not present

## 2019-07-17 LAB — COMPREHENSIVE METABOLIC PANEL
ALT: 17 U/L (ref 0–53)
AST: 14 U/L (ref 0–37)
Albumin: 4.7 g/dL (ref 3.5–5.2)
Alkaline Phosphatase: 71 U/L (ref 39–117)
BUN: 22 mg/dL (ref 6–23)
CO2: 34 mEq/L — ABNORMAL HIGH (ref 19–32)
Calcium: 9.9 mg/dL (ref 8.4–10.5)
Chloride: 98 mEq/L (ref 96–112)
Creatinine, Ser: 0.93 mg/dL (ref 0.40–1.50)
GFR: 88.77 mL/min (ref >60.00)
Glucose, Bld: 108 mg/dL — ABNORMAL HIGH (ref 70–99)
Potassium: 4.4 mEq/L (ref 3.5–5.1)
Sodium: 139 mEq/L (ref 135–145)
Total Bilirubin: 0.5 mg/dL (ref 0.2–1.2)
Total Protein: 7.3 g/dL (ref 6.0–8.3)

## 2019-07-17 LAB — HEMOGLOBIN A1C: Hgb A1c MFr Bld: 6.2 % (ref 4.6–6.5)

## 2019-07-17 MED ORDER — LOSARTAN POTASSIUM-HCTZ 100-12.5 MG PO TABS: 1.0000 | ORAL_TABLET | Freq: Every day | ORAL | 3 refills | Status: DC

## 2019-07-17 NOTE — Progress Notes (Signed)
Subjective:    Patient ID: Peter Fitzgerald, male    DOB: Jan 13, 1976, 44 y.o.   MRN: [id number]  HPI  This visit occurred during the SARS-CoV-2 public health emergency.  Safety protocols were in place, including screening questions prior to the visit, additional usage of staff PPE, and extensive cleaning of exam room while observing appropriate contact time as indicated for disinfecting solutions.   Peter Fitzgerald is a 44 year old male with a history of hypertension,  Hypothyroidism, chronic back pain, DDD who presents today for follow up of hypertension.  Currently managed on losartan-HCTZ 100-12.5 mg which was changed in mid March 2021 due to chronic ACE-I induced cough. Since then his home BP readings have been in the 120-150's/90-100's, so he was brought in today for further evaluation.   He's noticed increased cravings in food over the last one month since starting gabapentin, has gained some weight. He denies headaches,chest pain, visual disturbance. He has been on Meloxicam for the last two weeks, also on prednisone for about the same amount of time for plantar fasciitis.   BP Readings from Last 3 Encounters:  07/17/19 134/76  06/07/19 122/80  11/27/18 122/82   Wt Readings from Last 3 Encounters:  07/17/19 292 lb 12 oz (132.8 kg)  06/07/19 280 lb 12 oz (127.3 kg)  11/27/18 278 lb 8 oz (126.3 kg)     Review of Systems  Eyes: Negative for visual disturbance.  Cardiovascular: Negative for chest pain.  Neurological: Negative for dizziness and headaches.       Past Medical History:  Diagnosis Date  . Allergy   . Arthritis   . Chickenpox   . Frequent headaches   . Hypertension   . Migraines   . Thyroid disease      Social History   Socioeconomic History  . Marital status: Married    Spouse name: Not on file  . Number of children: Not on file  . Years of education: Not on file  . Highest education level: Not on file  Occupational History  . Not on file  Tobacco Use  .  Smoking status: Never Smoker  . Smokeless tobacco: Never Used  Substance and Sexual Activity  . Alcohol use: Not Currently  . Drug use: Not on file  . Sexual activity: Not on file  Other Topics Concern  . Not on file  Social History Narrative   Married.   One child.   Works as a Engineer, production.   Social Determinants of Health   Financial Resource Strain:   . Difficulty of Paying Living Expenses:   Food Insecurity:   . Worried About Programme researcher, broadcasting/film/video in the Last Year:   . Barista in the Last Year:   Transportation Needs:   . Freight forwarder (Medical):   Marland Kitchen Lack of Transportation (Non-Medical):   Physical Activity:   . Days of Exercise per Week:   . Minutes of Exercise per Session:   Stress:   . Feeling of Stress :   Social Connections:   . Frequency of Communication with Friends and Family:   . Frequency of Social Gatherings with Friends and Family:   . Attends Religious Services:   . Active Member of Clubs or Organizations:   . Attends Banker Meetings:   Marland Kitchen Marital Status:   Intimate Partner Violence:   . Fear of Current or Ex-Partner:   . Emotionally Abused:   Marland Kitchen Physically Abused:   . Sexually Abused:  Past Surgical History:  Procedure Laterality Date  . MINOR CARPAL TUNNEL  1997    Family History  Problem Relation Age of Onset  . Alcohol abuse Mother   . Arthritis Mother   . COPD Mother   . Drug abuse Mother   . Early death Mother        untreated COPD, CKD  . Kidney disease Mother        On dialysis   . Learning disabilities Mother   . Alcohol abuse Father   . Drug abuse Father   . Early death Father        Drowning    No Known Allergies  Current Outpatient Medications on File Prior to Visit  Medication Sig Dispense Refill  . cetirizine (ZYRTEC) 10 MG tablet Take 10 mg by mouth daily.    Marland Kitchen gabapentin (NEURONTIN) 300 MG capsule Take 300 mg by mouth at bedtime.    Marland Kitchen ibuprofen (ADVIL) 800 MG tablet Take 800 mg by mouth 2  (two) times daily.    Marland Kitchen levothyroxine (SYNTHROID) 50 MCG tablet TAKE 1 TABLET BY MOUTH EVERY MORNING ON AN EMPTY STOMACH WITH WATER ONLY. NO FOOD/MEDS FOR 30 MINS 30 tablet 5  . losartan-hydrochlorothiazide (HYZAAR) 100-12.5 MG tablet Take 1 tablet by mouth daily. For blood pressure. 30 tablet 0  . meloxicam (MOBIC) 15 MG tablet Take 1 tablet (15 mg total) by mouth daily. 30 tablet 3  . methylPREDNISolone (MEDROL DOSEPAK) 4 MG TBPK tablet 6 day dose pack - take as directed 21 tablet 0   No current facility-administered medications on file prior to visit.    BP 134/76   Pulse 92   Temp (!) 96.8 F (36 C) (Temporal)   Ht 5' 6.75" (1.695 m)   Wt 292 lb 12 oz (132.8 kg)   SpO2 97%   BMI 46.20 kg/m    Objective:   Physical Exam  Constitutional: He appears well-nourished.  Cardiovascular: Normal rate and regular rhythm.  Respiratory: Effort normal and breath sounds normal.  Musculoskeletal:     Cervical back: Neck supple.  Skin: Skin is warm and dry.           Assessment & Plan:

## 2019-07-17 NOTE — Assessment & Plan Note (Signed)
Overall stable, much better than home readings. Suspect slight increase due to weight gain from poor diet and large portion sizes.   Also on Meloxicam which could be contributing to elevations in BP.  Will have him work on weight loss, increase activity. Continue losartan-HCTZ. Refills provided.

## 2019-07-17 NOTE — Assessment & Plan Note (Addendum)
Noted on labs from August 2020. Given increased food cravings and obesity we will repeat A1C.

## 2019-07-17 NOTE — Patient Instructions (Signed)
Continue losartan-HCTZ for blood pressure.  Work on reducing portion sizes, improve your diet!  Continue exercising. You should be getting 150 minutes of moderate intensity exercise weekly.  It was a pleasure to see you today!

## 2019-07-23 ENCOUNTER — Ambulatory Visit (INDEPENDENT_AMBULATORY_CARE_PROVIDER_SITE_OTHER): Payer: PRIVATE HEALTH INSURANCE | Admitting: Orthotics

## 2019-07-23 ENCOUNTER — Other Ambulatory Visit: Payer: Self-pay

## 2019-07-23 DIAGNOSIS — M722 Plantar fascial fibromatosis: Secondary | ICD-10-CM | POA: Diagnosis not present

## 2019-07-23 DIAGNOSIS — M2141 Flat foot [pes planus] (acquired), right foot: Secondary | ICD-10-CM

## 2019-07-23 DIAGNOSIS — M2142 Flat foot [pes planus] (acquired), left foot: Secondary | ICD-10-CM

## 2019-07-23 NOTE — Progress Notes (Signed)
Patient presents with history of PTTD/Pes Planus (acquired).  Patient demonstrated medial shift talus/drop navicular, and medial column collapse.   Plan is for CMFO w/ longitudinal arch support, rear foot stability to address eversion.  Fabrication will be with semi rigid/rigid poly pro shell, deep heel seat, wide, padding under spenco cover. Plan on                                      To fabricate. 

## 2019-08-06 ENCOUNTER — Other Ambulatory Visit: Payer: Self-pay

## 2019-08-06 ENCOUNTER — Ambulatory Visit: Payer: PRIVATE HEALTH INSURANCE | Admitting: Orthotics

## 2019-08-06 DIAGNOSIS — M722 Plantar fascial fibromatosis: Secondary | ICD-10-CM

## 2019-08-06 NOTE — Progress Notes (Signed)
Patient came in today to pick up custom made foot orthotics.  The goals were accomplished and the patient reported no dissatisfaction with said orthotics.  Patient was advised of breakin period and how to report any issues. 

## 2019-08-08 ENCOUNTER — Other Ambulatory Visit: Payer: Self-pay

## 2019-08-08 ENCOUNTER — Encounter: Payer: Self-pay | Admitting: Podiatry

## 2019-08-08 ENCOUNTER — Ambulatory Visit (INDEPENDENT_AMBULATORY_CARE_PROVIDER_SITE_OTHER): Payer: PRIVATE HEALTH INSURANCE | Admitting: Podiatry

## 2019-08-08 DIAGNOSIS — M722 Plantar fascial fibromatosis: Secondary | ICD-10-CM

## 2019-08-08 NOTE — Progress Notes (Signed)
He presents today states that is doing much better I love my orthotics he says a nearly 100% improved at this point.  Continues to wear his orthotics daily and his [employer] he is also taking his meloxicam daily.  Objective: Vital signs are stable he is alert and oriented x3.  Pulses are palpable.  He has no reproducible pain on palpation medial calcaneal tubercles bilaterally.  Assessment: Resolving plan fasciitis around 100% according to the patient.  Plan: Encouraged him to continue all of his conservative therapies until he is 1 month past 100% better.  He understands this and is amenable to it we will follow-up with me with any recurrences.

## 2019-09-04 ENCOUNTER — Ambulatory Visit: Payer: PRIVATE HEALTH INSURANCE | Attending: Neurosurgery

## 2019-09-04 ENCOUNTER — Other Ambulatory Visit: Payer: Self-pay

## 2019-09-04 DIAGNOSIS — M2560 Stiffness of unspecified joint, not elsewhere classified: Secondary | ICD-10-CM | POA: Diagnosis present

## 2019-09-04 DIAGNOSIS — M546 Pain in thoracic spine: Secondary | ICD-10-CM | POA: Diagnosis not present

## 2019-09-04 NOTE — Patient Instructions (Signed)
1-2x/day prone oin elbows 30 sec 1-2 reps, W and shoulder extesnsion lifts  With scap retraction x 10 , arm openers x 5-10  RT/LT thoracic ext/flex 5-10 reps

## 2019-09-04 NOTE — Therapy (Addendum)
[facility] Outpatient Rehabilitation [facility] [address] [place], [place], [address] Phone: [phone]   Fax:  [phone]  Physical Therapy Evaluation  Patient Details  Name: Peter Fitzgerald MRN: [id number] Date of Birth: [birth year] Referring Provider (PT): Tressie Stalker, MD   Encounter Date: 09/04/2019  PT End of Session - 09/04/19 0957    Visit Number  1    Number of Visits  12    Date for PT Re-Evaluation  10/19/19    Authorization Type  [employer]    PT Start Time  1000    PT Stop Time  1045    PT Time Calculation (min)  45 min    Activity Tolerance  Patient tolerated treatment well    Behavior During Therapy  [facility] for tasks assessed/performed       Past Medical History:  Diagnosis Date  . Allergy   . Arthritis   . Chickenpox   . Frequent headaches   . Hypertension   . Migraines   . Thyroid disease     Past Surgical History:  Procedure Laterality Date  . MINOR CARPAL TUNNEL  1997    There were no vitals filed for this visit.   Subjective Assessment - 09/04/19 1001    Subjective  He reports thoracic pain.  Also burning pain LT leg as pre surgery.   rolling over and sitting incr pain.  No injury. Has DDD.    Pertinent History  lumbar laminextomy last year    Limitations  Sitting    How long can you sit comfortably?  varies 20 min    How long can you stand comfortably?  as needed    How long can you walk comfortably?  as needed    Diagnostic tests  Xray/MRI:  DDD    Patient Stated Goals  He wants to return to pain free life         [facility] PT Assessment - 09/04/19 0001      Assessment   Medical Diagnosis  thoracic pain    Referring Provider (PT)  Tressie Stalker, MD    Onset Date/Surgical Date  --   4 months ago   Next MD Visit  10/21    Prior Therapy  No      Precautions   Precautions  None      Restrictions   Weight Bearing Restrictions  No      Balance Screen   Has the patient fallen in the past 6 months  No      Prior  Function   Level of Independence  Independent    Vocation  Full time employment    Vocation Requirements  stands as Health visitor   Overall Cognitive Status  Within Functional Limits for tasks assessed      Posture/Postural Control   Posture Comments  WNL      ROM / Strength   AROM / PROM / Strength  AROM;Strength      AROM   AROM Assessment Site  Thoracic      Strength   Overall Strength Comments  WNL, abdominals fair      Flexibility   Soft Tissue Assessment /Muscle Length  yes    Hamstrings  WFL      Palpation   Palpation comment  Tender whole thoracic spine and lower lumbar       Ambulation/Gait   Gait Comments  WNL  Objective measurements completed on examination: See above findings.              PT Education - 09/04/19 1006    Education Details  POC,, HEP    Person(s) Educated  Patient    Methods  Explanation;Demonstration;Tactile cues;Verbal cues;Handout    Comprehension  Verbalized understanding;Returned demonstration       PT Short Term Goals - 09/04/19 0958      PT SHORT TERM GOAL #1   Title  [name] will be independent with intial HEP    Time  3    Period  Weeks    Status  New      PT SHORT TERM GOAL #2   Title  He will report paindecxreASED 20% IN THoRACIC Spine    Time  3    Period  Weeks    Status  New      PT SHORT TERM GOAL #3   Title  He will report greater ease with lifting 50# bags for work    Time  3    Period  Weeks    Status  New      PT SHORT TERM GOAL #4   Title  FOTO reviewed in first 3 sessions        PT Long Term Goals - 09/04/19 0958      PT LONG TERM GOAL #1   Title  He will be independent with all hEP issued    Time  6    Period  Weeks    Status  New      PT LONG TERM GOAL #2   Title  He will report pain in thoracic spine decreased 50% or more with work tasks    Time  6    Period  Weeks    Status  New      PT LONG TERM GOAL #3   Title  He will improve FOTO score to   65 points to demo improved function    Time  6    Period  Weeks    Status  New      PT LONG TERM GOAL #4   Title  He will report able to lift cast iron pans for baking    Time  6    Period  Weeks    Status  New     , mobs and HEP.     Plan ; Mr Hackler presents with chronic thoracic pain He is post lumbar surgery and is now having some of the same symptoms as prior to surgery.  He should do well with skilled PT  For T/S with stretching and core stability for weakness and stiffness in spine. His job requires heavy lifting so this may limit progress.  Next session ROM and core strength and progress HEP.,     Plan - 09/04/19 0957    Personal Factors and Comorbidities  Past/Current Experience;Time since onset of injury/illness/exacerbation    Examination-Activity Limitations  Sit;Lift;Bend;Carry    Examination-Participation Restrictions  --   baking modifictions for work   Stability/Clinical Decision Making  Stable/Uncomplicated    Clinical Decision Making  Low    Rehab Potential  Good    PT Frequency  2x / week    PT Duration  6 weeks    PT Treatment/Interventions  Passive range of motion;Dry needling;Manual techniques;Therapeutic exercise;Therapeutic activities;Patient/family education;Electrical Stimulation;Moist Heat;Traction    Consulted and Agree with Plan of Care  Patient       Patient will  benefit from skilled therapeutic intervention in order to improve the following deficits and impairments:  Pain, Decreased activity tolerance  Visit Diagnosis: Pain in thoracic spine  Joint stiffness of spine     Problem List Patient Active Problem List   Diagnosis Date Noted  . Prediabetes 07/17/2019  . Chronic midline thoracic back pain 06/07/2019  . Essential hypertension 11/15/2018  . Hypothyroidism 11/15/2018  . DDD (degenerative disc disease), lumbar 11/15/2018    Caprice Red  PT 09/04/2019, 10:50 AM  [facility] [address] [place], [place], [address] Phone: [phone]   Fax:  [phone]  Name: Peter Fitzgerald MRN: [id number] Date of Birth: 1975/08/27

## 2019-09-10 ENCOUNTER — Encounter: Payer: Self-pay | Admitting: Physical Therapy

## 2019-09-10 ENCOUNTER — Other Ambulatory Visit: Payer: Self-pay

## 2019-09-10 ENCOUNTER — Ambulatory Visit: Payer: PRIVATE HEALTH INSURANCE | Admitting: Physical Therapy

## 2019-09-10 DIAGNOSIS — M546 Pain in thoracic spine: Secondary | ICD-10-CM

## 2019-09-10 DIAGNOSIS — M2560 Stiffness of unspecified joint, not elsewhere classified: Secondary | ICD-10-CM

## 2019-09-10 NOTE — Therapy (Signed)
[facility] Outpatient Rehabilitation [facility] [address] [place], [place], [address] Phone: [phone]   Fax:  [phone]  Physical Therapy Treatment  Patient Details  Name: Peter Fitzgerald MRN: [id number] Date of Birth: 1975/09/19 Referring Provider (PT): Tressie Stalker, MD   Encounter Date: 09/10/2019   PT End of Session - 09/10/19 1315    Visit Number 2    Number of Visits 12    Date for PT Re-Evaluation 10/19/19    Authorization Type [employer]    PT Start Time 1315    PT Stop Time 1400    PT Time Calculation (min) 45 min           Past Medical History:  Diagnosis Date   Allergy    Arthritis    Chickenpox    Frequent headaches    Hypertension    Migraines    Thyroid disease     Past Surgical History:  Procedure Laterality Date   MINOR CARPAL TUNNEL  1997    There were no vitals filed for this visit.   Subjective Assessment - 09/10/19 1318    Subjective Pt arrives reporting compliance with HEP however increased pain with the exercises.    Currently in Pain? Yes    Pain Score 3                              [facility] Adult PT Treatment/Exercise - 09/10/19 0001      Lumbar Exercises: Stretches   Single Knee to Chest Stretch 2 reps;10 seconds    Prone on Elbows Stretch 10 seconds    Prone on Elbows Stretch Limitations 10 resp, cues to use arms to lift and lower upper body- less painful     Other Lumbar Stretch Exercise leaning on counter- protract/retract, seated in chair- thoracic extension , hands on chest     Other Lumbar Stretch Exercise side lying open books      Lumbar Exercises: Standing   Row 10 reps    Theraband Level (Row) Level 3 (Green)    Shoulder Extension 10 reps    Theraband Level (Shoulder Extension) Level 2 (Red)    Other Standing Lumbar Exercises single arm red band horizontal abduction into trunk rotation x 10 each way       Lumbar Exercises: Supine   Pelvic Tilt 10 reps    Pelvic Tilt  Limitations gentle     Bent Knee Raise 10 reps    Bent Knee Raise Limitations cues for abdominal draw in     Bridge 5 reps    Bridge Limitations too painful       Lumbar Exercises: Prone   Other Prone Lumbar Exercises Prone retract with extension( pain with extension) , Prone W                     PT Short Term Goals - 09/04/19 2263      PT SHORT TERM GOAL #1   Title [name] will be independent with intial HEP    Time 3    Period Weeks    Status New      PT SHORT TERM GOAL #2   Title He will report paindecxreASED 20% IN THoRACIC Spine    Time 3    Period Weeks    Status New      PT SHORT TERM GOAL #3   Title He will report greater ease with lifting 50# bags for work  Time 3    Period Weeks    Status New      PT SHORT TERM GOAL #4   Title FOTO reviewed in first 3 sessions    Baseline reviewed today    Time 2    Period Weeks    Status Achieved             PT Long Term Goals - 09/04/19 0102      PT LONG TERM GOAL #1   Title He will be independent with all hEP issued    Time 6    Period Weeks    Status New      PT LONG TERM GOAL #2   Title He will report pain in thoracic spine decreased 50% or more with work tasks    Time 6    Period Weeks    Status New      PT LONG TERM GOAL #3   Title He will improve FOTO score to  65 points to demo improved function    Time 6    Period Weeks    Status New      PT LONG TERM GOAL #4   Title He will report able to lift cast iron pans for baking    Time 6    Period Weeks    Status New                 Plan - 09/10/19 1321    Clinical Impression Statement Pt reports pain with transitins on mat. He reports difficuly completing full HE P due to increased pain with transitions and some of the exercises.Time spent reviewing HEP. Pt was issued scapuar bands and rotation with bands which was tolerated without as much pain.    Stability/Clinical Decision Making Stable/Uncomplicated    PT  Treatment/Interventions Passive range of motion;Dry needling;Manual techniques;Therapeutic exercise;Therapeutic activities;Patient/family education;Electrical Stimulation;Moist Heat;Traction    PT Next Visit Plan adjust HEP PRN, check tolerance to scap bands, try standing core stability?    PT Home Exercise Plan initial (per pt): prone on elbows, prone W, Prone retract with shoulder extension, open books , seated thoracic extension with arm crossed, leaning on counter scap protract? added scap bands and trunk rotations with single arm    Consulted and Agree with Plan of Care Patient           Patient will benefit from skilled therapeutic intervention in order to improve the following deficits and impairments:     Visit Diagnosis: Pain in thoracic spine  Joint stiffness of spine     Problem List Patient Active Problem List   Diagnosis Date Noted   Prediabetes 07/17/2019   Chronic midline thoracic back pain 06/07/2019   Essential hypertension 11/15/2018   Hypothyroidism 11/15/2018   DDD (degenerative disc disease), lumbar 11/15/2018    Dorene Ar, PTA 09/10/2019, 2:18 PM  [facility] [address] [place], [place], [address] Phone: [phone]   Fax:  [phone]  Name: Mali Ramberg MRN: [id number] Date of Birth: 1975-10-17

## 2019-09-17 ENCOUNTER — Other Ambulatory Visit: Payer: Self-pay

## 2019-09-17 ENCOUNTER — Ambulatory Visit: Payer: PRIVATE HEALTH INSURANCE

## 2019-09-17 DIAGNOSIS — M2560 Stiffness of unspecified joint, not elsewhere classified: Secondary | ICD-10-CM

## 2019-09-17 DIAGNOSIS — M546 Pain in thoracic spine: Secondary | ICD-10-CM | POA: Diagnosis not present

## 2019-09-17 NOTE — Therapy (Signed)
[facility] Outpatient Rehabilitation [facility] [address] [place], [place], [address] Phone: [phone]   Fax:  [phone]  Physical Therapy Treatment  Patient Details  Name: Peter Fitzgerald MRN: [id number] Date of Birth: [birth year] Referring Provider (PT): Tressie Stalker, MD   Encounter Date: 09/17/2019   PT End of Session - 09/17/19 1404    Visit Number 3    Number of Visits 12    Date for PT Re-Evaluation 10/19/19    Authorization Type [employer]    PT Start Time 0204    PT Stop Time 0245    PT Time Calculation (min) 41 min    Activity Tolerance Patient tolerated treatment well    Behavior During Therapy [facility] for tasks assessed/performed           Past Medical History:  Diagnosis Date  . Allergy   . Arthritis   . Chickenpox   . Frequent headaches   . Hypertension   . Migraines   . Thyroid disease     Past Surgical History:  Procedure Laterality Date  . MINOR CARPAL TUNNEL  1997    There were no vitals filed for this visit.   Subjective Assessment - 09/17/19 1407    Subjective hE REPORTS STILL IN BACK PAIN AND LEG PAIN IS GETTING WORSE .WILL SEE MD TOMORROW.    Currently in Pain? Yes    Pain Score 1     Pain Location Back    Pain Orientation Right;Left;Mid;Upper    Pain Descriptors / Indicators Aching    Pain Type Chronic pain    Pain Radiating Towards lt LEG    Pain Onset More than a month ago    Pain Frequency Constant    Aggravating Factors  lifting at work    Pain Relieving Factors nothing specific    Multiple Pain Sites Yes                             [facility] Adult PT Treatment/Exercise - 09/17/19 0001      Exercises   Exercises Lumbar      Lumbar Exercises: Stretches   Single Knee to Chest Stretch Right;Left;2 reps;20 seconds    Lower Trunk Rotation 5 reps    Lower Trunk Rotation Limitations RT/LT      Other Lumbar Stretch Exercise side lying open books  RT/LT x 5 10 sec stretch      Lumbar Exercises:  Aerobic   UBE (Upper Arm Bike) L1 5 min      Lumbar Exercises: Standing   Other Standing Lumbar Exercises flatten back to walll  with gentle reach and breathing       Lumbar Exercises: Supine   Other Supine Lumbar Exercises Legs on green ball  with PPT and hamstring isometrics into ball 10 se x 6 reps      Manual Therapy   Manual Therapy Joint mobilization;Soft tissue mobilization    Joint Mobilization PA mogs to thoracic spinevery gentle but too painfu    Soft tissue mobilization --           HMP x 10 min          PT Short Term Goals - 09/04/19 4174      PT SHORT TERM GOAL #1   Title [name] will be independent with intial HEP    Time 3    Period Weeks    Status New      PT SHORT TERM GOAL #2  Title He will report paindecxreASED 20% IN THoRACIC Spine    Time 3    Period Weeks    Status New      PT SHORT TERM GOAL #3   Title He will report greater ease with lifting 50# bags for work    Time 3    Period Weeks    Status New      PT SHORT TERM GOAL #4   Title FOTO reviewed in first 3 sessions    Baseline reviewed today    Time 2    Period Weeks    Status Achieved             PT Long Term Goals - 09/04/19 6160      PT LONG TERM GOAL #1   Title He will be independent with all hEP issued    Time 6    Period Weeks    Status New      PT LONG TERM GOAL #2   Title He will report pain in thoracic spine decreased 50% or more with work tasks    Time 6    Period Weeks    Status New      PT LONG TERM GOAL #3   Title He will improve FOTO score to  65 points to demo improved function    Time 6    Period Weeks    Status New      PT LONG TERM GOAL #4   Title He will report able to lift cast iron pans for baking    Time 6    Period Weeks    Status New                 Plan - 09/17/19 1442    Clinical Impression Statement It was too painful for even gentle manual and he did all exercise with pain. He shows no prefernece as to direction that pain is  eased or increased as all motions hurt.  I don't think there is much to offer at this time and may have some underlying issue causing his pain ( rhematological). He will see his MD tomorrow and will discuss. Willl follow up next session and progress as able    PT Treatment/Interventions Passive range of motion;Dry needling;Manual techniques;Therapeutic exercise;Therapeutic activities;Patient/family education;Electrical Stimulation;Moist Heat;Traction    PT Next Visit Plan will stop all HEP and ask him to use his TENS and heat at homme and follow MD reccomendations.    Consulted and Agree with Plan of Care Patient           Patient will benefit from skilled therapeutic intervention in order to improve the following deficits and impairments:  Pain, Decreased activity tolerance  Visit Diagnosis: Pain in thoracic spine  Joint stiffness of spine     Problem List Patient Active Problem List   Diagnosis Date Noted  . Prediabetes 07/17/2019  . Chronic midline thoracic back pain 06/07/2019  . Essential hypertension 11/15/2018  . Hypothyroidism 11/15/2018  . DDD (degenerative disc disease), lumbar 11/15/2018    Peter Fitzgerald  PT 09/17/2019, 2:46 PM  [facility] [address] [place], [place], [address] Phone: [phone]   Fax:  [phone]  Name: Peter Fitzgerald MRN: [id number] Date of Birth: 09/20/1975

## 2019-09-19 ENCOUNTER — Ambulatory Visit: Payer: PRIVATE HEALTH INSURANCE | Admitting: Physical Therapy

## 2019-09-24 ENCOUNTER — Other Ambulatory Visit: Payer: Self-pay

## 2019-09-24 ENCOUNTER — Ambulatory Visit (INDEPENDENT_AMBULATORY_CARE_PROVIDER_SITE_OTHER): Payer: PRIVATE HEALTH INSURANCE | Admitting: Primary Care

## 2019-09-24 ENCOUNTER — Encounter: Payer: Self-pay | Admitting: Primary Care

## 2019-09-24 VITALS — BP 118/78 | HR 73 | Ht 66.75 in | Wt 278.0 lb

## 2019-09-24 DIAGNOSIS — M546 Pain in thoracic spine: Secondary | ICD-10-CM | POA: Diagnosis not present

## 2019-09-24 DIAGNOSIS — G8929 Other chronic pain: Secondary | ICD-10-CM

## 2019-09-24 DIAGNOSIS — M5136 Other intervertebral disc degeneration, lumbar region: Secondary | ICD-10-CM | POA: Diagnosis not present

## 2019-09-24 DIAGNOSIS — E039 Hypothyroidism, unspecified: Secondary | ICD-10-CM

## 2019-09-24 LAB — URIC ACID: Uric Acid, Serum: 10.3 mg/dL — ABNORMAL HIGH (ref 4.0–7.8)

## 2019-09-24 LAB — SEDIMENTATION RATE: Sed Rate: 29 mm/hr — ABNORMAL HIGH (ref 0–15)

## 2019-09-24 LAB — C-REACTIVE PROTEIN: CRP: 1 mg/dL (ref 0.5–20.0)

## 2019-09-24 LAB — TSH: TSH: 4.1 u[IU]/mL (ref 0.35–4.50)

## 2019-09-24 NOTE — Progress Notes (Signed)
Subjective:    Patient ID: Peter Fitzgerald, male    DOB: 03/22/1976, 44 y.o.   MRN: [id number]  HPI  This visit occurred during the SARS-CoV-2 public health emergency.  Safety protocols were in place, including screening questions prior to the visit, additional usage of staff PPE, and extensive cleaning of exam room while observing appropriate contact time as indicated for disinfecting solutions.   Peter Fitzgerald is a 44 year old male with a history of hypertension, hypothyroidism, DDD lumbar spine, chronic mid back pain who presents today to discuss chronic back pain. He is also due for thyroid check.  He is currently following with neurosurgery who referred him to physical therapy and pain management. He's completed four sessions of physical therapy which has actually caused increased pain. He is scheduled for a lumbar spinal injection for August 2021 per pain management, also just placed on extended release gabapentin.   He continues to notice daily, stabbing, lower lumbar and thoracic back pain that radiates out laterally.  He was told by his neurosurgeon that he may have rheumatoid arthritis, recommended work up for this. He's not sure if he has a family history of autoimmune arthritis. He thinks he had his RF checked 15 years ago which was slightly elevated.   Wt Readings from Last 3 Encounters:  09/24/19 278 lb (126.1 kg)  07/17/19 292 lb 12 oz (132.8 kg)  06/07/19 280 lb 12 oz (127.3 kg)   BP Readings from Last 3 Encounters:  09/24/19 118/78  07/17/19 134/76  06/07/19 122/80     Review of Systems  Musculoskeletal: Positive for arthralgias and back pain.  Skin: Negative for color change.  Neurological: Negative for weakness.       Past Medical History:  Diagnosis Date  . Allergy   . Arthritis   . Chickenpox   . Frequent headaches   . Hypertension   . Migraines   . Thyroid disease      Social History   Socioeconomic History  . Marital status: Married    Spouse name: Not on  file  . Number of children: Not on file  . Years of education: Not on file  . Highest education level: Not on file  Occupational History  . Not on file  Tobacco Use  . Smoking status: Never Smoker  . Smokeless tobacco: Never Used  Substance and Sexual Activity  . Alcohol use: Not Currently  . Drug use: Not on file  . Sexual activity: Not on file  Other Topics Concern  . Not on file  Social History Narrative   Married.   One child.   Works as a Child psychotherapist.   Social Determinants of Health   Financial Resource Strain:   . Difficulty of Paying Living Expenses:   Food Insecurity:   . Worried About Charity fundraiser in the Last Year:   . Arboriculturist in the Last Year:   Transportation Needs:   . Film/video editor (Medical):   Marland Kitchen Lack of Transportation (Non-Medical):   Physical Activity:   . Days of Exercise per Week:   . Minutes of Exercise per Session:   Stress:   . Feeling of Stress :   Social Connections:   . Frequency of Communication with Friends and Family:   . Frequency of Social Gatherings with Friends and Family:   . Attends Religious Services:   . Active Member of Clubs or Organizations:   . Attends Archivist Meetings:   .  Marital Status:   Intimate Partner Violence:   . Fear of Current or Ex-Partner:   . Emotionally Abused:   Marland Kitchen Physically Abused:   . Sexually Abused:     Past Surgical History:  Procedure Laterality Date  . MINOR CARPAL TUNNEL  1997    Family History  Problem Relation Age of Onset  . Alcohol abuse Mother   . Arthritis Mother   . COPD Mother   . Drug abuse Mother   . Early death Mother        untreated COPD, CKD  . Kidney disease Mother        On dialysis   . Learning disabilities Mother   . Alcohol abuse Father   . Drug abuse Father   . Early death Father        Drowning    No Known Allergies  Current Outpatient Medications on File Prior to Visit  Medication Sig Dispense Refill  . cetirizine (ZYRTEC) 10 MG  tablet Take 10 mg by mouth daily.    Marland Kitchen gabapentin (NEURONTIN) 300 MG capsule Take 300 mg by mouth at bedtime.    Marland Kitchen HORIZANT 300 MG TBCR Take 1 tablet by mouth 2 (two) times daily.    Marland Kitchen ibuprofen (ADVIL) 800 MG tablet Take 800 mg by mouth 2 (two) times daily.    Marland Kitchen levothyroxine (SYNTHROID) 50 MCG tablet TAKE 1 TABLET BY MOUTH EVERY MORNING ON AN EMPTY STOMACH WITH WATER ONLY. NO FOOD/MEDS FOR 30 MINS 30 tablet 5  . losartan-hydrochlorothiazide (HYZAAR) 100-12.5 MG tablet Take 1 tablet by mouth daily. For blood pressure. 90 tablet 3   No current facility-administered medications on file prior to visit.    BP 118/78   Pulse 73   Ht 5' 6.75" (1.695 m)   Wt 278 lb (126.1 kg)   SpO2 98%   BMI 43.87 kg/m    Objective:   Physical Exam Constitutional:      Appearance: Normal appearance.  Pulmonary:     Effort: Pulmonary effort is normal.  Musculoskeletal:     Thoracic back: Spasms and tenderness present. No bony tenderness. Decreased range of motion.     Lumbar back: No bony tenderness. Decreased range of motion.       Back:     Comments: Moderate decrease in ROM with movement due to pain. Intermittent spasms during visit.   Neurological:     Mental Status: He is alert.  Psychiatric:        Mood and Affect: Mood normal.            Assessment & Plan:

## 2019-09-24 NOTE — Assessment & Plan Note (Signed)
Chronic and continued, despite PT treatment. Work up for rheumatoid arthritis and gout pending.

## 2019-09-24 NOTE — Patient Instructions (Signed)
Stop by the lab prior to leaving today. I will notify you of your results once received.   It was a pleasure to see you today!  

## 2019-09-24 NOTE — Assessment & Plan Note (Signed)
Repeat TSH pending

## 2019-09-24 NOTE — Assessment & Plan Note (Signed)
Chronic and continued, following with neurosurgery, PT, and pain management.   Checking labs for RA and chronic gout.

## 2019-09-25 ENCOUNTER — Encounter: Payer: Self-pay | Admitting: Physical Therapy

## 2019-09-25 ENCOUNTER — Ambulatory Visit: Payer: PRIVATE HEALTH INSURANCE | Admitting: Physical Therapy

## 2019-09-25 DIAGNOSIS — M546 Pain in thoracic spine: Secondary | ICD-10-CM | POA: Diagnosis not present

## 2019-09-25 DIAGNOSIS — M1A9XX0 Chronic gout, unspecified, without tophus (tophi): Secondary | ICD-10-CM

## 2019-09-25 DIAGNOSIS — M2560 Stiffness of unspecified joint, not elsewhere classified: Secondary | ICD-10-CM

## 2019-09-25 LAB — RHEUMATOID FACTOR: Rheumatoid fact SerPl-aCnc: <14 IU/mL (ref <14)

## 2019-09-25 LAB — CYCLIC CITRUL PEPTIDE ANTIBODY, IGG: Cyclic Citrullin Peptide Ab: <16 UNITS

## 2019-09-25 MED ORDER — ALLOPURINOL 100 MG PO TABS: 100.0000 mg | ORAL_TABLET | Freq: Every day | ORAL | 0 refills | Status: DC

## 2019-09-25 NOTE — Therapy (Signed)
[facility] [address] [place], [place], [address] Phone: [phone]   Fax:  [phone]  Physical Therapy Treatment  Patient Details  Name: Peter Fitzgerald MRN: [id number] Date of Birth: April 19, 1975 Referring Provider (PT): Tressie Stalker, MD   Encounter Date: 09/25/2019   PT End of Session - 09/25/19 0850    Visit Number 4    Number of Visits 12    Date for PT Re-Evaluation 10/19/19    Authorization Type [employer]    PT Start Time 0840    PT Stop Time 0925    PT Time Calculation (min) 45 min           Past Medical History:  Diagnosis Date  . Allergy   . Arthritis   . Chickenpox   . Frequent headaches   . Hypertension   . Migraines   . Thyroid disease     Past Surgical History:  Procedure Laterality Date  . MINOR CARPAL TUNNEL  1997    There were no vitals filed for this visit.   Subjective Assessment - 09/25/19 0845    Subjective Saw primary MD yesterday who drew blood. So far I got my inflammation lab back and it was high, waiting on results of other, RA factor was low.    Currently in Pain? Yes    Pain Score 2    up to 6-7/10 with transitions   Pain Location Back    Pain Orientation Right;Left;Mid;Upper    Pain Descriptors / Indicators Sharp;Aching    Pain Type Chronic pain    Aggravating Factors  driving and hitting bumps in the road, transitions    Pain Relieving Factors not transitioning.                             [facility] Adult PT Treatment/Exercise - 09/25/19 0001      Lumbar Exercises: Aerobic   UBE (Upper Arm Bike) L1 5 min, retro       Lumbar Exercises: Standing   Row 20 reps    Theraband Level (Row) Level 3 (Green)    Shoulder Extension 20 reps    Theraband Level (Shoulder Extension) Level 2 (Red)    Other Standing Lumbar Exercises green band above head- pull downs x 20       Lumbar Exercises: Seated   Other Seated Lumbar Exercises sit-fit stability ball on mat- dead  bugs, knee extensions, narrow feet with green band horizontal abduction, pelvic tilits, pelvic circles    2 inch step under feet for alignment                    PT Short Term Goals - 09/04/19 4268      PT SHORT TERM GOAL #1   Title [name] will be independent with intial HEP    Time 3    Period Weeks    Status New      PT SHORT TERM GOAL #2   Title He will report paindecxreASED 20% IN THoRACIC Spine    Time 3    Period Weeks    Status New      PT SHORT TERM GOAL #3   Title He will report greater ease with lifting 50# bags for work    Time 3    Period Weeks    Status New      PT SHORT TERM GOAL #4   Title FOTO reviewed in first 3 sessions  Baseline reviewed today    Time 2    Period Weeks    Status Achieved             PT Long Term Goals - 09/04/19 7829      PT LONG TERM GOAL #1   Title He will be independent with all hEP issued    Time 6    Period Weeks    Status New      PT LONG TERM GOAL #2   Title He will report pain in thoracic spine decreased 50% or more with work tasks    Time 6    Period Weeks    Status New      PT LONG TERM GOAL #3   Title He will improve FOTO score to  65 points to demo improved function    Time 6    Period Weeks    Status New      PT LONG TERM GOAL #4   Title He will report able to lift cast iron pans for baking    Time 6    Period Weeks    Status New                 Plan - 09/25/19 5621    Clinical Impression Statement Pt reports MD is checking labs. He is awating results. Symptoms unchanged. Maintained seated/stanidng position to avoid increasing pain. Focused on core and spinal stability and scap stabiization. USed sit-fit core for stability which he tolerated well.    PT Next Visit Plan will stop all HEP and ask him to use his TENS and heat at homme and follow MD reccomendations. assess response to last session    PT Home Exercise Plan initial (per pt): prone on elbows, prone W, Prone retract with  shoulder extension, open books , seated thoracic extension with arm crossed, leaning on counter scap protract? added scap bands and trunk rotations with single arm           Patient will benefit from skilled therapeutic intervention in order to improve the following deficits and impairments:  Pain, Decreased activity tolerance  Visit Diagnosis: Pain in thoracic spine  Joint stiffness of spine     Problem List Patient Active Problem List   Diagnosis Date Noted  . Prediabetes 07/17/2019  . Chronic midline thoracic back pain 06/07/2019  . Essential hypertension 11/15/2018  . Hypothyroidism 11/15/2018  . DDD (degenerative disc disease), lumbar 11/15/2018    Sherrie Mustache, PTA 09/25/2019, 9:31 AM  [facility] [address] [place], [place], [address] Phone: [phone]   Fax:  [phone]  Name: Peter Fitzgerald MRN: [id number] Date of Birth: 1976/02/23

## 2019-09-26 ENCOUNTER — Encounter: Payer: Self-pay | Admitting: Physical Therapy

## 2019-09-26 ENCOUNTER — Ambulatory Visit: Payer: PRIVATE HEALTH INSURANCE | Admitting: Physical Therapy

## 2019-09-26 ENCOUNTER — Other Ambulatory Visit: Payer: Self-pay

## 2019-09-26 DIAGNOSIS — M546 Pain in thoracic spine: Secondary | ICD-10-CM

## 2019-09-26 DIAGNOSIS — M2560 Stiffness of unspecified joint, not elsewhere classified: Secondary | ICD-10-CM

## 2019-09-26 NOTE — Therapy (Signed)
[facility] Outpatient Rehabilitation [facility] [address] [place], [place], [address] Phone: [phone]   Fax:  [phone]  Physical Therapy Treatment  Patient Details  Name: Peter Fitzgerald MRN: [id number] Date of Birth: 21-Jan-1976 Referring Provider (PT): Tressie Stalker, MD   Encounter Date: 09/26/2019   PT End of Session - 09/26/19 0937    Visit Number 5    Number of Visits 12    Date for PT Re-Evaluation 10/19/19    Authorization Type [employer]    PT Start Time 0932    PT Stop Time 1015    PT Time Calculation (min) 43 min           Past Medical History:  Diagnosis Date  . Allergy   . Arthritis   . Chickenpox   . Frequent headaches   . Hypertension   . Migraines   . Thyroid disease     Past Surgical History:  Procedure Laterality Date  . MINOR CARPAL TUNNEL  1997    There were no vitals filed for this visit.   Subjective Assessment - 09/26/19 0936    Subjective Rough getting up this morning, really stiff.    Currently in Pain? Yes    Pain Score 3    up to 6-7/10 with transitions   Pain Location Back    Pain Orientation Right;Left;Mid;Upper    Pain Descriptors / Indicators Aching;Sharp    Pain Type Chronic pain                             [facility] Adult PT Treatment/Exercise - 09/26/19 0001      Lumbar Exercises: Aerobic   UBE (Upper Arm Bike) L2.5 5 min, retro       Lumbar Exercises: Standing   Row 20 reps    Theraband Level (Row) Level 3 (Green)    Shoulder Extension 20 reps    Theraband Level (Shoulder Extension) Level 2 (Red)    Other Standing Lumbar Exercises green band above head- pull downs x 20 , Bilat ER x 20     Other Standing Lumbar Exercises protract/retract with hands on wall x 20 : with back against soft foam roller on wall: alt UE flexion, horizontal abduction with green band, Foam roller turned horizontal: hands behind head gentle thoracic extension and rotation.       Lumbar Exercises: Seated   Other  Seated Lumbar Exercises sit-fit stability ball on mat- dead bugs, knee extensions, narrow feet with green band horizontal abduction, pelvic tilits, pelvic circles    2 inch step under feet for alignment                    PT Short Term Goals - 09/04/19 0958      PT SHORT TERM GOAL #1   Title [name] will be independent with intial HEP    Time 3    Period Weeks    Status New      PT SHORT TERM GOAL #2   Title He will report paindecxreASED 20% IN THoRACIC Spine    Time 3    Period Weeks    Status New      PT SHORT TERM GOAL #3   Title He will report greater ease with lifting 50# bags for work    Time 3    Period Weeks    Status New      PT SHORT TERM GOAL #4   Title FOTO reviewed  in first 3 sessions    Baseline reviewed today    Time 2    Period Weeks    Status Achieved             PT Long Term Goals - 09/04/19 9678      PT LONG TERM GOAL #1   Title He will be independent with all hEP issued    Time 6    Period Weeks    Status New      PT LONG TERM GOAL #2   Title He will report pain in thoracic spine decreased 50% or more with work tasks    Time 6    Period Weeks    Status New      PT LONG TERM GOAL #3   Title He will improve FOTO score to  65 points to demo improved function    Time 6    Period Weeks    Status New      PT LONG TERM GOAL #4   Title He will report able to lift cast iron pans for baking    Time 6    Period Weeks    Status New                 Plan - 09/26/19 9381    Clinical Impression Statement Pt reports he had increased pain and stiffness this morning after yesterday's treatment. Continued with the same therex as last session and he tolerated it well. Added standing foam roller stabilization and ROM exercises. He has difficulty with full ROM shoulder flexion, lacks thoracic extension.    PT Treatment/Interventions Passive range of motion;Dry needling;Manual techniques;Therapeutic exercise;Therapeutic  activities;Patient/family education;Electrical Stimulation;Moist Heat;Traction    PT Next Visit Plan will stop all HEP and ask him to use his TENS and heat at homme and follow MD reccomendations. assess response to last sessions, needs FOTO status, possible discharge    PT Home Exercise Plan HOLD ALL HEP :initial (per pt): prone on elbows, prone W, Prone retract with shoulder extension, open books , seated thoracic extension with arm crossed, leaning on counter scap protract? added scap bands and trunk rotations with single arm           Patient will benefit from skilled therapeutic intervention in order to improve the following deficits and impairments:  Pain, Decreased activity tolerance  Visit Diagnosis: Pain in thoracic spine  Joint stiffness of spine     Problem List Patient Active Problem List   Diagnosis Date Noted  . Prediabetes 07/17/2019  . Chronic midline thoracic back pain 06/07/2019  . Essential hypertension 11/15/2018  . Hypothyroidism 11/15/2018  . DDD (degenerative disc disease), lumbar 11/15/2018    Sherrie Mustache, PTA 09/26/2019, 10:48 AM  [facility] [address] [place], [place], [address] Phone: [phone]   Fax:  [phone]  Name: Peter Fitzgerald MRN: [id number] Date of Birth: 08/17/1975

## 2019-10-02 ENCOUNTER — Ambulatory Visit: Payer: PRIVATE HEALTH INSURANCE

## 2019-10-08 ENCOUNTER — Other Ambulatory Visit: Payer: Self-pay

## 2019-10-08 ENCOUNTER — Ambulatory Visit: Payer: PRIVATE HEALTH INSURANCE | Attending: Neurosurgery | Admitting: Physical Therapy

## 2019-10-08 ENCOUNTER — Encounter: Payer: Self-pay | Admitting: Physical Therapy

## 2019-10-08 DIAGNOSIS — M2560 Stiffness of unspecified joint, not elsewhere classified: Secondary | ICD-10-CM | POA: Insufficient documentation

## 2019-10-08 DIAGNOSIS — M546 Pain in thoracic spine: Secondary | ICD-10-CM | POA: Diagnosis not present

## 2019-10-08 NOTE — Therapy (Addendum)
Peter Fitzgerald, [place], [address] Phone: [phone]   Fax:  [phone]  Physical Therapy Treatment/Discharge  Patient Details  Name: Peter Fitzgerald MRN: [id number] Date of Birth: 1975/08/16 Referring Provider (PT): Peter Pies, MD   Encounter Date: 10/08/2019   PT End of Session - 10/08/19 0817    Visit Number 6    Number of Visits 12    Date for PT Re-Evaluation 10/19/19    Authorization Type [employer]    PT Start Time 0801    PT Stop Time 0843    PT Time Calculation (min) 42 min           Past Medical History:  Diagnosis Date  . Allergy   . Arthritis   . Chickenpox   . Frequent headaches   . Hypertension   . Migraines   . Thyroid disease     Past Surgical History:  Procedure Laterality Date  . MINOR CARPAL TUNNEL  1997    There were no vitals filed for this visit.   Subjective Assessment - 10/08/19 0811    Subjective It is a really rough morning. Pain is 3-4/10 with sharp pains when driving over bumps on the road.    Currently in Pain? Yes    Pain Score 4     Pain Location Back    Pain Orientation Right;Left;Mid;Upper    Pain Descriptors / Indicators Aching;Sharp    Pain Type Chronic pain    Aggravating Factors  driving over bumpy roads, transitions    Pain Relieving Factors avoid painful motions              [facility] PT Assessment - 10/08/19 0001      Observation/Other Assessments   Focus on Therapeutic Outcomes (FOTO)  61%  limited                          [facility] Adult PT Treatment/Exercise - 10/08/19 0001      Lumbar Exercises: Aerobic   UBE (Upper Arm Bike) L2.5 5 min, retro       Lumbar Exercises: Standing   Row 20 reps    Theraband Level (Row) Level 3 (Green)    Shoulder Extension 20 reps    Theraband Level (Shoulder Extension) Level 2 (Red)    Other Standing Lumbar Exercises green band above head- pull downs x 20 , Bilat ER x 20     Other Standing  Lumbar Exercises protract/retract with hands on wall x 20 : with back against soft foam roller on wall: alt UE flexion, horizontal abduction with green band, Foam roller turned horizontal: hands behind head gentle thoracic extension and rotation.       Lumbar Exercises: Seated   Other Seated Lumbar Exercises sit-fit stability ball on mat- dead bugs, knee extensions, narrow feet with green band horizontal abduction, pelvic tilits, pelvic circles    2 inch step under feet for alignment                    PT Short Term Goals - 10/08/19 0815      PT SHORT TERM GOAL #1   Title [name] will be independent with intial HEP    Time 3    Period Weeks    Status Achieved      PT SHORT TERM GOAL #2   Title He will report paindecxreASED 20% IN THoRACIC Spine    Baseline no change  Time 3    Period Weeks    Status Not Met      PT SHORT TERM GOAL #3   Title He will report greater ease with lifting 50# bags for work    Time 3    Period Weeks    Status Not Met      PT SHORT TERM GOAL #4   Title FOTO reviewed in first 3 sessions    Baseline reviewed today    Period Weeks    Status Achieved             PT Long Term Goals - 10/08/19 0815      PT LONG TERM GOAL #1   Title He will be independent with all hEP issued    Time 6    Period Weeks    Status Achieved      PT LONG TERM GOAL #2   Title He will report pain in thoracic spine decreased 50% or more with work tasks    Time 6    Period Weeks    Status Not Met      PT LONG TERM GOAL #3   Title He will improve FOTO score to  65 points to demo improved function    Time 6    Period Weeks    Status Not Met      PT LONG TERM GOAL #4   Title He will report able to lift cast iron pans for baking    Time 6    Period Weeks    Status Not Met                 Plan - 10/08/19 0827    Clinical Impression Statement Pt reports he is the same, no improvement. Most goals were not met. FOTO score worse. He would like to  discharge from physical therapy and return to MD for further assessment.    PT Next Visit Plan discahrge today, return to MD    PT Home Exercise Plan HOLD ALL HEP :initial (per pt): prone on elbows, prone W, Prone retract with shoulder extension, open books , seated thoracic extension with arm crossed, leaning on counter scap protract? added scap bands and trunk rotations with single arm           Patient will benefit from skilled therapeutic intervention in order to improve the following deficits and impairments:  Pain, Decreased activity tolerance  Visit Diagnosis: Pain in thoracic spine  Joint stiffness of spine     Problem List Patient Active Problem List   Diagnosis Date Noted  . Prediabetes 07/17/2019  . Chronic midline thoracic back pain 06/07/2019  . Essential hypertension 11/15/2018  . Hypothyroidism 11/15/2018  . DDD (degenerative disc disease), lumbar 11/15/2018    Peter Fitzgerald, PTA 10/08/2019, 8:44 AM  [facility] [address] [place], [place], [address] Phone: [phone]   Fax:  [phone]  Name: Peter Fitzgerald MRN: [id number] Date of Birth: 08-Jul-1975  PHYSICAL THERAPY DISCHARGE SUMMARY  Visits from Start of Care: 6  Current functional level related to goals / functional outcomes: See above.  He reports pain possible related to gout    Remaining deficits: See above   Education / Equipment: HEP  Plan: Patient agrees to discharge.  Patient goals were not met. Patient is being discharged due to lack of progress.  ?????   Peter Fitzgerald PT 10/08/19

## 2019-10-10 ENCOUNTER — Ambulatory Visit: Payer: PRIVATE HEALTH INSURANCE | Admitting: Physical Therapy

## 2019-10-26 ENCOUNTER — Other Ambulatory Visit: Payer: Self-pay | Admitting: Podiatry

## 2019-11-21 DIAGNOSIS — M1A9XX0 Chronic gout, unspecified, without tophus (tophi): Secondary | ICD-10-CM

## 2019-11-22 ENCOUNTER — Other Ambulatory Visit: Payer: Self-pay | Admitting: Podiatry

## 2019-11-28 ENCOUNTER — Other Ambulatory Visit: Payer: Self-pay

## 2019-11-28 ENCOUNTER — Other Ambulatory Visit (INDEPENDENT_AMBULATORY_CARE_PROVIDER_SITE_OTHER): Payer: PRIVATE HEALTH INSURANCE

## 2019-11-28 DIAGNOSIS — M1A9XX0 Chronic gout, unspecified, without tophus (tophi): Secondary | ICD-10-CM | POA: Diagnosis not present

## 2019-11-28 LAB — URIC ACID: Uric Acid, Serum: 6.7 mg/dL (ref 4.0–7.8)

## 2019-11-30 ENCOUNTER — Ambulatory Visit
Admission: EM | Admit: 2019-11-30 | Discharge: 2019-11-30 | Disposition: A | Discharge status: Home / Self Care | Payer: PRIVATE HEALTH INSURANCE | Attending: Family Medicine | Admitting: Family Medicine

## 2019-11-30 ENCOUNTER — Other Ambulatory Visit: Payer: Self-pay

## 2019-11-30 DIAGNOSIS — R0981 Nasal congestion: Secondary | ICD-10-CM

## 2019-11-30 DIAGNOSIS — R030 Elevated blood-pressure reading, without diagnosis of hypertension: Secondary | ICD-10-CM | POA: Diagnosis not present

## 2019-11-30 DIAGNOSIS — R52 Pain, unspecified: Secondary | ICD-10-CM

## 2019-11-30 DIAGNOSIS — R768 Other specified abnormal immunological findings in serum: Secondary | ICD-10-CM

## 2019-11-30 DIAGNOSIS — Z1152 Encounter for screening for COVID-19: Secondary | ICD-10-CM

## 2019-11-30 DIAGNOSIS — R519 Headache, unspecified: Secondary | ICD-10-CM

## 2019-11-30 DIAGNOSIS — B349 Viral infection, unspecified: Secondary | ICD-10-CM

## 2019-11-30 HISTORY — DX: Gout, unspecified: M10.9

## 2019-11-30 LAB — ANA: Anti Nuclear Antibody (ANA): POSITIVE — AB

## 2019-11-30 LAB — ANTI-NUCLEAR AB-TITER (ANA TITER): ANA Titer 1: 1:80 {titer} — ABNORMAL HIGH

## 2019-11-30 MED ORDER — CETIRIZINE-PSEUDOEPHEDRINE ER 5-120 MG PO TB12: 1.0000 | ORAL_TABLET | Freq: Every day | ORAL | 0 refills | Status: DC

## 2019-11-30 MED ORDER — FLUTICASONE PROPIONATE 50 MCG/ACT NA SUSP: 2.0000 | Freq: Every day | NASAL | 2 refills | Status: DC

## 2019-11-30 NOTE — Discharge Instructions (Addendum)
Your COVID test is pending.  You should self quarantine until the test result is back.    Take Tylenol as needed for fever or discomfort.  Rest and keep yourself hydrated.    I have sent in zyrtec D for you to use daily  I have also sent in flonase for you to use 2 sprays in each nostril daily  Go to the emergency department if you develop acute worsening symptoms.

## 2019-11-30 NOTE — ED Provider Notes (Signed)
[facility]   [id number] 11/30/19 Arrival Time: 0940   CC: COVID symptoms  SUBJECTIVE: History from: patient.  Peter Fitzgerald is a 44 y.o. male who presents with abrupt onset of nasal congestion, PND, body aches and persistent dry cough since yesterday. Denies sick exposure to COVID, flu or strep. Denies recent travel. Has previous history of Covid. Has not completed Covid vaccines. Has not taken OTC medications for this. There are no aggravating or alleviating factors. Denies previous symptoms in the past. Denies fever, chills, fatigue, sinus pain, rhinorrhea, sore throat, SOB, wheezing, chest pain, nausea, changes in bowel or bladder habits.    ROS: As per HPI.  All other pertinent ROS negative.     Past Medical History:  Diagnosis Date  . Allergy   . Arthritis   . Chickenpox   . Frequent headaches   . Gout   . Hypertension   . Migraines   . Thyroid disease    Past Surgical History:  Procedure Laterality Date  . LAMINECTOMY AND MICRODISCECTOMY LUMBAR SPINE  12/2018  . MINOR CARPAL TUNNEL  1997   No Known Allergies No current facility-administered medications on file prior to encounter.   Current Outpatient Medications on File Prior to Encounter  Medication Sig Dispense Refill  . allopurinol (ZYLOPRIM) 100 MG tablet Take 1 tablet (100 mg total) by mouth daily. For gout prevention. 90 tablet 0  . cetirizine (ZYRTEC) 10 MG tablet Take 10 mg by mouth daily.    Marland Kitchen HORIZANT 300 MG TBCR Take 1 tablet by mouth 2 (two) times daily.    Marland Kitchen ibuprofen (ADVIL) 800 MG tablet Take 800 mg by mouth 2 (two) times daily.    Marland Kitchen levothyroxine (SYNTHROID) 50 MCG tablet TAKE 1 TABLET BY MOUTH EVERY MORNING ON AN EMPTY STOMACH WITH WATER ONLY. NO FOOD/MEDS FOR 30 MINS 30 tablet 5  . losartan-hydrochlorothiazide (HYZAAR) 100-12.5 MG tablet Take 1 tablet by mouth daily. For blood pressure. 90 tablet 3  . meloxicam (MOBIC) 15 MG tablet TAKE 1 TABLET BY MOUTH EVERY DAY 30 tablet 0  . gabapentin  (NEURONTIN) 300 MG capsule Take 300 mg by mouth at bedtime.     Social History   Socioeconomic History  . Marital status: Married    Spouse name: Not on file  . Number of children: Not on file  . Years of education: Not on file  . Highest education level: Not on file  Occupational History  . Not on file  Tobacco Use  . Smoking status: Never Smoker  . Smokeless tobacco: Never Used  Vaping Use  . Vaping Use: Never used  Substance and Sexual Activity  . Alcohol use: Not Currently  . Drug use: Never  . Sexual activity: Not on file  Other Topics Concern  . Not on file  Social History Narrative   Married.   One child.   Works as a Engineer, production.   Social Determinants of Health   Financial Resource Strain:   . Difficulty of Paying Living Expenses: Not on file  Food Insecurity:   . Worried About Programme researcher, broadcasting/film/video in the Last Year: Not on file  . Ran Out of Food in the Last Year: Not on file  Transportation Needs:   . Lack of Transportation (Medical): Not on file  . Lack of Transportation (Non-Medical): Not on file  Physical Activity:   . Days of Exercise per Week: Not on file  . Minutes of Exercise per Session: Not on file  Stress:   .  Feeling of Stress : Not on file  Social Connections:   . Frequency of Communication with Friends and Family: Not on file  . Frequency of Social Gatherings with Friends and Family: Not on file  . Attends Religious Services: Not on file  . Active Member of Clubs or Organizations: Not on file  . Attends Banker Meetings: Not on file  . Marital Status: Not on file  Intimate Partner Violence:   . Fear of Current or Ex-Partner: Not on file  . Emotionally Abused: Not on file  . Physically Abused: Not on file  . Sexually Abused: Not on file   Family History  Problem Relation Age of Onset  . Alcohol abuse Mother   . Arthritis Mother   . COPD Mother   . Drug abuse Mother   . Early death Mother        untreated COPD, CKD  . Kidney  disease Mother        On dialysis   . Learning disabilities Mother   . Alcohol abuse Father   . Drug abuse Father   . Early death Father        Drowning    OBJECTIVE:  Vitals:   11/30/19 0948  BP: (!) 165/122  Pulse: 63  Resp: 16  Temp: 97.6 F (36.4 C)  TempSrc: Oral  SpO2: 97%     General appearance: alert; appears fatigued, but nontoxic; speaking in full sentences and tolerating own secretions HEENT: NCAT; Ears: EACs clear, TMs pearly gray; Eyes: PERRL.  EOM grossly intact. Sinuses: nontender; Nose: nares patent without rhinorrhea, Throat: oropharynx clear, tonsils non erythematous or enlarged, uvula midline  Neck: supple without LAD Lungs: unlabored respirations, symmetrical air entry; cough: absent; no respiratory distress; CTAB Heart: regular rate and rhythm.  Radial pulses 2+ symmetrical bilaterally Skin: warm and dry Psychological: alert and cooperative; normal mood and affect  LABS:  No results found for this or any previous visit (from the past 24 hour(s)).   ASSESSMENT & PLAN:  1. Viral illness   2. Body aches   3. Nonintractable headache, unspecified chronicity pattern, unspecified headache type   4. Encounter for screening for COVID-19   5. Elevated blood pressure reading   6. Nasal congestion     Meds ordered this encounter  Medications  . cetirizine-pseudoephedrine (ZYRTEC-D) 5-120 MG tablet    Sig: Take 1 tablet by mouth daily.    Dispense:  30 tablet    Refill:  0    Order Specific Question:   Supervising Provider    Answer:   Merrilee Jansky [id number]  . fluticasone (FLONASE) 50 MCG/ACT nasal spray    Sig: Place 2 sprays into both nostrils daily.    Dispense:  9.9 mL    Refill:  2    Order Specific Question:   Supervising Provider    Answer:   Merrilee Jansky [id number]    Prescribed zyrtec D Prescribed flonase  COVID testing ordered.  It will take between 1-2 days for test results.  Someone will contact you regarding abnormal  results.    Patient should remain in quarantine until they have received Covid results.  If negative you may resume normal activities (go back to work/school) while practicing hand hygiene, social distance, and mask wearing.  If positive, patient should remain in quarantine for 10 days from symptom onset AND greater than 72 hours after symptoms resolution (absence of fever without the use of fever-reducing medication and improvement in respiratory symptoms),  whichever is longer Get plenty of rest and push fluids Use OTC zyrtec for nasal congestion, runny nose, and/or sore throat Use OTC flonase for nasal congestion and runny nose Use medications daily for symptom relief Use OTC medications like ibuprofen or tylenol as needed fever or pain Call or go to the ED if you have any new or worsening symptoms such as fever, worsening cough, shortness of breath, chest tightness, chest pain, turning blue, changes in mental status.  Reviewed expectations re: course of current medical issues. Questions answered. Outlined signs and symptoms indicating need for more acute intervention. Patient verbalized understanding. After Visit Summary given.         Moshe Cipro, NP 11/30/19 1007

## 2019-11-30 NOTE — ED Triage Notes (Signed)
Pt reports body aches primarily upper body, HA, scratchy throat, very mild cough x 1 day.  No fever.  Has not tried any home tx.    Had COVID in January.  Is not vaccinated.

## 2019-12-01 LAB — NOVEL CORONAVIRUS, NAA: SARS-CoV-2, NAA: NOT DETECTED

## 2019-12-12 ENCOUNTER — Other Ambulatory Visit: Payer: Self-pay | Admitting: Primary Care

## 2019-12-12 DIAGNOSIS — M1A9XX0 Chronic gout, unspecified, without tophus (tophi): Secondary | ICD-10-CM

## 2019-12-12 NOTE — Telephone Encounter (Signed)
Peter Fitzgerald, see message. I put in a new referral to [facility]. Just FYI

## 2019-12-14 ENCOUNTER — Other Ambulatory Visit: Payer: Self-pay | Admitting: Podiatry

## 2019-12-27 ENCOUNTER — Other Ambulatory Visit: Payer: Self-pay | Admitting: Primary Care

## 2019-12-27 DIAGNOSIS — E039 Hypothyroidism, unspecified: Secondary | ICD-10-CM

## 2020-01-06 ENCOUNTER — Other Ambulatory Visit: Payer: Self-pay | Admitting: Podiatry

## 2020-02-05 ENCOUNTER — Other Ambulatory Visit: Payer: Self-pay | Admitting: Podiatry

## 2020-06-22 ENCOUNTER — Other Ambulatory Visit: Payer: Self-pay | Admitting: Primary Care

## 2020-06-22 DIAGNOSIS — E039 Hypothyroidism, unspecified: Secondary | ICD-10-CM

## 2020-06-22 NOTE — Telephone Encounter (Signed)
Patient will need CPE/follow up in June for further refills. Please schedule.

## 2020-06-23 NOTE — Telephone Encounter (Signed)
Called patient and schedule 6/28.

## 2020-07-19 ENCOUNTER — Other Ambulatory Visit: Payer: Self-pay | Admitting: Primary Care

## 2020-07-19 DIAGNOSIS — I10 Essential (primary) hypertension: Secondary | ICD-10-CM

## 2020-09-21 ENCOUNTER — Other Ambulatory Visit: Payer: Self-pay | Admitting: Primary Care

## 2020-09-21 DIAGNOSIS — E039 Hypothyroidism, unspecified: Secondary | ICD-10-CM

## 2020-09-22 ENCOUNTER — Telehealth: Payer: Self-pay | Admitting: Primary Care

## 2020-09-22 NOTE — Telephone Encounter (Signed)
Called pt for appt reminder and he stated that he test positive with a home test on 6/15. He does not have any other symptoms.

## 2020-09-22 NOTE — Telephone Encounter (Signed)
Patient called and informed me that he has no symptoms he has an appointment tomorrow, per [facility] guidelines he is clear to come in because it's past the mandatory 10 day period.

## 2020-09-23 ENCOUNTER — Ambulatory Visit (INDEPENDENT_AMBULATORY_CARE_PROVIDER_SITE_OTHER): Payer: No Typology Code available for payment source | Admitting: Primary Care

## 2020-09-23 ENCOUNTER — Encounter: Payer: Self-pay | Admitting: Primary Care

## 2020-09-23 ENCOUNTER — Other Ambulatory Visit: Payer: Self-pay

## 2020-09-23 VITALS — BP 140/100 | HR 77 | Temp 98.2°F | Ht 66.75 in | Wt 272.5 lb

## 2020-09-23 DIAGNOSIS — R7303 Prediabetes: Secondary | ICD-10-CM

## 2020-09-23 DIAGNOSIS — M1A9XX0 Chronic gout, unspecified, without tophus (tophi): Secondary | ICD-10-CM | POA: Diagnosis not present

## 2020-09-23 DIAGNOSIS — E039 Hypothyroidism, unspecified: Secondary | ICD-10-CM

## 2020-09-23 DIAGNOSIS — M546 Pain in thoracic spine: Secondary | ICD-10-CM

## 2020-09-23 DIAGNOSIS — Z0000 Encounter for general adult medical examination without abnormal findings: Secondary | ICD-10-CM

## 2020-09-23 DIAGNOSIS — Z23 Encounter for immunization: Secondary | ICD-10-CM | POA: Diagnosis not present

## 2020-09-23 DIAGNOSIS — I10 Essential (primary) hypertension: Secondary | ICD-10-CM | POA: Diagnosis not present

## 2020-09-23 DIAGNOSIS — M79643 Pain in unspecified hand: Secondary | ICD-10-CM

## 2020-09-23 DIAGNOSIS — G8929 Other chronic pain: Secondary | ICD-10-CM

## 2020-09-23 LAB — COMPREHENSIVE METABOLIC PANEL WITH GFR
ALT: 23 U/L (ref 0–53)
AST: 16 U/L (ref 0–37)
Albumin: 4.8 g/dL (ref 3.5–5.2)
Alkaline Phosphatase: 83 U/L (ref 39–117)
BUN: 14 mg/dL (ref 6–23)
CO2: 34 meq/L — ABNORMAL HIGH (ref 19–32)
Calcium: 10 mg/dL (ref 8.4–10.5)
Chloride: 99 meq/L (ref 96–112)
Creatinine, Ser: 0.95 mg/dL (ref 0.40–1.50)
GFR: 97.72 mL/min
Glucose, Bld: 108 mg/dL — ABNORMAL HIGH (ref 70–99)
Potassium: 4.3 meq/L (ref 3.5–5.1)
Sodium: 139 meq/L (ref 135–145)
Total Bilirubin: 0.6 mg/dL (ref 0.2–1.2)
Total Protein: 7.1 g/dL (ref 6.0–8.3)

## 2020-09-23 LAB — LIPID PANEL
Cholesterol: 235 mg/dL — ABNORMAL HIGH (ref 0–200)
HDL: 44.8 mg/dL (ref >39.00)
NonHDL: 189.82
Total CHOL/HDL Ratio: 5
Triglycerides: 275 mg/dL — ABNORMAL HIGH (ref 0.0–149.0)
VLDL: 55 mg/dL — ABNORMAL HIGH (ref 0.0–40.0)

## 2020-09-23 LAB — LDL CHOLESTEROL, DIRECT: Direct LDL: 147 mg/dL

## 2020-09-23 LAB — URIC ACID: Uric Acid, Serum: 6.9 mg/dL (ref 4.0–7.8)

## 2020-09-23 LAB — HEMOGLOBIN A1C: Hgb A1c MFr Bld: 6 % (ref 4.6–6.5)

## 2020-09-23 LAB — TSH: TSH: 6.44 u[IU]/mL — ABNORMAL HIGH (ref 0.35–4.50)

## 2020-09-23 NOTE — Patient Instructions (Signed)
Stop by the lab prior to leaving today. I will notify you of your results once received.   Start monitoring your blood pressure daily, around the same time of day, for the next 2-3 weeks.  Ensure that you have rested for 30 minutes prior to checking your blood pressure. Record your readings and send them to me via My Chart.  Be sure to take your levothyroxine (thyroid medication) every morning on an empty stomach with water only.   No food or other medications for 30 minutes.   No heartburn medication, iron pills, calcium, vitamin D, or magnesium pills within four hours of taking levothyroxine.   It was a pleasure to see you today!  Preventive Care 46-53 Years Old, Male Preventive care refers to lifestyle choices and visits with your health care provider that can promote health and wellness. This includes: A yearly physical exam. This is also called an annual wellness visit. Regular dental and eye exams. Immunizations. Screening for certain conditions. Healthy lifestyle choices, such as: Eating a healthy diet. Getting regular exercise. Not using drugs or products that contain nicotine and tobacco. Limiting alcohol use. What can I expect for my preventive care visit? Physical exam Your health care provider will check your: Height and weight. These may be used to calculate your BMI (body mass index). BMI is a measurement that tells if you are at a healthy weight. Heart rate and blood pressure. Body temperature. Skin for abnormal spots. Counseling Your health care provider may ask you questions about your: Past medical problems. Family's medical history. Alcohol, tobacco, and drug use. Emotional well-being. Home life and relationship well-being. Sexual activity. Diet, exercise, and sleep habits. Work and work Astronomer. Access to firearms. What immunizations do I need?  Vaccines are usually given at various ages, according to a schedule. Your health care provider will  recommend vaccines for you based on your age, medicalhistory, and lifestyle or other factors, such as travel or where you work. What tests do I need? Blood tests Lipid and cholesterol levels. These may be checked every 5 years, or more often if you are over 75 years old. Hepatitis C test. Hepatitis B test. Screening Lung cancer screening. You may have this screening every year starting at age 50 if you have a 30-pack-year history of smoking and currently smoke or have quit within the past 15 years. Prostate cancer screening. Recommendations will vary depending on your family history and other risks. Genital exam to check for testicular cancer or hernias. Colorectal cancer screening. All adults should have this screening starting at age 6 and continuing until age 82. Your health care provider may recommend screening at age 9 if you are at increased risk. You will have tests every 1-10 years, depending on your results and the type of screening test. Diabetes screening. This is done by checking your blood sugar (glucose) after you have not eaten for a while (fasting). You may have this done every 1-3 years. STD (sexually transmitted disease) testing, if you are at risk. Follow these instructions at home: Eating and drinking  Eat a diet that includes fresh fruits and vegetables, whole grains, lean protein, and low-fat dairy products. Take vitamin and mineral supplements as recommended by your health care provider. Do not drink alcohol if your health care provider tells you not to drink. If you drink alcohol: Limit how much you have to 0-2 drinks a day. Be aware of how much alcohol is in your drink. In the [place] one drink equals one  12 oz bottle of beer (355 mL), one 5 oz glass of wine (148 mL), or one 1 oz glass of hard liquor (44 mL).  Lifestyle Take daily care of your teeth and gums. Brush your teeth every morning and night with fluoride toothpaste. Floss one time each day. Stay  active. Exercise for at least 30 minutes 5 or more days each week. Do not use any products that contain nicotine or tobacco, such as cigarettes, e-cigarettes, and chewing tobacco. If you need help quitting, ask your health care provider. Do not use drugs. If you are sexually active, practice safe sex. Use a condom or other form of protection to prevent STIs (sexually transmitted infections). If told by your health care provider, take low-dose aspirin daily starting at age 60. Find healthy ways to cope with stress, such as: Meditation, yoga, or listening to music. Journaling. Talking to a trusted person. Spending time with friends and family. Safety Always wear your seat belt while driving or riding in a vehicle. Do not drive: If you have been drinking alcohol. Do not ride with someone who has been drinking. When you are tired or distracted. While texting. Wear a helmet and other protective equipment during sports activities. If you have firearms in your house, make sure you follow all gun safety procedures. What's next? Go to your health care provider once a year for an annual wellness visit. Ask your health care provider how often you should have your eyes and teeth checked. Stay up to date on all vaccines. This information is not intended to replace advice given to you by your health care provider. Make sure you discuss any questions you have with your healthcare provider. Document Revised: 12/12/2018 Document Reviewed: 03/09/2018 Elsevier Patient Education  2022 [employer].

## 2020-09-23 NOTE — Assessment & Plan Note (Signed)
Tetanus due, provided today. ? ?Discussed the importance of a healthy diet and regular exercise in order for weight loss, and to reduce the risk of further co-morbidity. ? ?Exam today stable. ?Labs pending. ?

## 2020-09-23 NOTE — Assessment & Plan Note (Signed)
Discussed the importance of a healthy diet and regular exercise in order for weight loss, and to reduce the risk of further co-morbidity. ? ?Repeat A1C pending. ?

## 2020-09-23 NOTE — Assessment & Plan Note (Signed)
Taking levothyroxine incorrectly several days weekly.  Reminded him of proper administration.   Continue levothyroxine 50 mcg for now, repeat TSH pending.

## 2020-09-23 NOTE — Progress Notes (Signed)
Subjective:    Patient ID: Peter Fitzgerald, male    DOB: 09-08-1975, 45 y.o.   MRN: [id number]  HPI  Peter Fitzgerald is a very pleasant 45 y.o. male who presents today for complete physical.  Immunizations: -Tetanus: Due  -Influenza: Did not complete last season  -Covid-19: Never completed   Diet: Fair diet.  Exercise: No regular exercise, some walking   Eye exam: No recent visit   Dental exam: Completes semi-annually   BP Readings from Last 3 Encounters:  09/23/20 (!) 140/100  11/30/19 (!) 165/122  09/24/19 118/78   He does not check his BP at home    Review of Systems  Constitutional:  Negative for unexpected weight change.  HENT:  Negative for rhinorrhea.   Eyes:  Negative for visual disturbance.  Respiratory:  Negative for shortness of breath.   Cardiovascular:  Negative for chest pain.  Gastrointestinal:  Negative for constipation and diarrhea.  Genitourinary:  Negative for difficulty urinating.  Musculoskeletal:  Positive for arthralgias and back pain.  Skin:  Negative for rash.  Allergic/Immunologic: Negative for environmental allergies.  Neurological:  Negative for dizziness and headaches.  Psychiatric/Behavioral:  The patient is not nervous/anxious.         Past Medical History:  Diagnosis Date   Allergy    Arthritis    Chickenpox    Frequent headaches    Gout    Hypertension    Migraines    Thyroid disease     Social History   Socioeconomic History   Marital status: Married    Spouse name: Not on file   Number of children: Not on file   Years of education: Not on file   Highest education level: Not on file  Occupational History   Not on file  Tobacco Use   Smoking status: Never   Smokeless tobacco: Never  Vaping Use   Vaping Use: Never used  Substance and Sexual Activity   Alcohol use: Not Currently   Drug use: Never   Sexual activity: Not on file  Other Topics Concern   Not on file  Social History Narrative   Married.   One child.    Works as a Engineer, production.   Social Determinants of Health   Financial Resource Strain: Not on file  Food Insecurity: Not on file  Transportation Needs: Not on file  Physical Activity: Not on file  Stress: Not on file  Social Connections: Not on file  Intimate Partner Violence: Not on file    Past Surgical History:  Procedure Laterality Date   LAMINECTOMY AND MICRODISCECTOMY LUMBAR SPINE  12/2018   MINOR CARPAL TUNNEL  1997    Family History  Problem Relation Age of Onset   Alcohol abuse Mother    Arthritis Mother    COPD Mother    Drug abuse Mother    Early death Mother        untreated COPD, CKD   Kidney disease Mother        On dialysis    Learning disabilities Mother    Alcohol abuse Father    Drug abuse Father    Early death Father        Drowning    No Known Allergies  Current Outpatient Medications on File Prior to Visit  Medication Sig Dispense Refill   allopurinol (ZYLOPRIM) 100 MG tablet TAKE 1 TABLET (100 MG TOTAL) BY MOUTH DAILY. FOR GOUT PREVENTION. 30 tablet 2   cetirizine (ZYRTEC) 10 MG tablet Take 10 mg  by mouth daily.     HORIZANT 300 MG TBCR Take 1 tablet by mouth 2 (two) times daily.     levothyroxine (SYNTHROID) 50 MCG tablet TAKE 1 TABLET BY MOUTH EVERY MORNING ON AN EMPTY STOMACH WITH WATER ONLY. NO FOOD/MEDS FOR 30 MINS 90 tablet 0   losartan-hydrochlorothiazide (HYZAAR) 100-12.5 MG tablet TAKE 1 TABLET BY MOUTH DAILY FOR BLOOD PRESSURE 90 tablet 0   meloxicam (MOBIC) 15 MG tablet TAKE 1 TABLET BY MOUTH EVERY DAY 30 tablet 0   No current facility-administered medications on file prior to visit.    There were no vitals taken for this visit. Objective:   Physical Exam HENT:     Right Ear: Tympanic membrane and ear canal normal.     Left Ear: Tympanic membrane and ear canal normal.     Nose: Nose normal.     Right Sinus: No maxillary sinus tenderness or frontal sinus tenderness.     Left Sinus: No maxillary sinus tenderness or frontal sinus  tenderness.  Eyes:     Conjunctiva/sclera: Conjunctivae normal.  Neck:     Thyroid: No thyromegaly.     Vascular: No carotid bruit.  Cardiovascular:     Rate and Rhythm: Normal rate and regular rhythm.     Heart sounds: Normal heart sounds.  Pulmonary:     Effort: Pulmonary effort is normal.     Breath sounds: Normal breath sounds. No wheezing or rales.  Abdominal:     General: Bowel sounds are normal.     Palpations: Abdomen is soft.     Tenderness: There is no abdominal tenderness.  Musculoskeletal:        General: Normal range of motion.     Cervical back: Neck supple.  Skin:    General: Skin is warm and dry.  Neurological:     Mental Status: He is alert and oriented to person, place, and time.     Cranial Nerves: No cranial nerve deficit.     Deep Tendon Reflexes: Reflexes are normal and symmetric.  Psychiatric:        Mood and Affect: Mood normal.          Assessment & Plan:      This visit occurred during the SARS-CoV-2 public health emergency.  Safety protocols were in place, including screening questions prior to the visit, additional usage of staff PPE, and extensive cleaning of exam room while observing appropriate contact time as indicated for disinfecting solutions.

## 2020-09-23 NOTE — Assessment & Plan Note (Signed)
Following with rheumatology, continue allopurinol 100 mg, repeat uric acid level pending

## 2020-09-23 NOTE — Assessment & Plan Note (Signed)
Above goal in the office today, is compliant to his medication daily, is not checking BP at home.   Discussed to start checking BP at home and send readings in 2 weeks. Would add amlodipine 5 mg if BP is consistently above 130/90.  Also discussed that recurrent NSAID use will increase BP. He verbalized understanding and cannot stop his Meloxicam.

## 2020-09-23 NOTE — Assessment & Plan Note (Signed)
Follows with pain management, continue Horizant 300 mg.

## 2020-09-23 NOTE — Assessment & Plan Note (Signed)
Bilaterally.  Follows with rheumatology.  Discussed that recurrent NSAID use can contribute to elevated BP.

## 2020-10-20 DIAGNOSIS — I10 Essential (primary) hypertension: Secondary | ICD-10-CM

## 2020-10-22 MED ORDER — LOSARTAN POTASSIUM-HCTZ 100-25 MG PO TABS: 1.0000 | ORAL_TABLET | Freq: Every day | ORAL | 0 refills | Status: DC

## 2020-10-27 ENCOUNTER — Other Ambulatory Visit: Payer: Self-pay | Admitting: Primary Care

## 2020-10-27 DIAGNOSIS — I10 Essential (primary) hypertension: Secondary | ICD-10-CM

## 2020-10-27 DIAGNOSIS — E039 Hypothyroidism, unspecified: Secondary | ICD-10-CM

## 2020-11-05 ENCOUNTER — Ambulatory Visit: Payer: No Typology Code available for payment source | Admitting: Primary Care

## 2020-11-17 ENCOUNTER — Other Ambulatory Visit: Payer: Self-pay | Admitting: Primary Care

## 2020-11-17 DIAGNOSIS — I10 Essential (primary) hypertension: Secondary | ICD-10-CM

## 2020-11-19 ENCOUNTER — Ambulatory Visit: Payer: No Typology Code available for payment source | Admitting: Primary Care

## 2020-11-19 ENCOUNTER — Other Ambulatory Visit: Payer: Self-pay

## 2020-11-19 ENCOUNTER — Encounter: Payer: Self-pay | Admitting: Primary Care

## 2020-11-19 DIAGNOSIS — I10 Essential (primary) hypertension: Secondary | ICD-10-CM

## 2020-11-19 MED ORDER — LOSARTAN POTASSIUM-HCTZ 100-25 MG PO TABS: 1.0000 | ORAL_TABLET | Freq: Every day | ORAL | 2 refills | Status: DC

## 2020-11-19 NOTE — Progress Notes (Signed)
Established Patient Office Visit  Subjective:  Patient ID: Peter Fitzgerald, male    DOB: 06-28-1975  Age: 45 y.o. MRN: [id number]  CC:  Chief Complaint  Patient presents with   Follow-up    BP f/u    HPI Peter Villamor 45 y/o presenting today for blood pressure follow up. Had previous episodes of dizziness, they have now subsided approximately 2 weeks ago. Has not taken BP at home in 2 weeks. Plans on resuming BP monitoring at home.   Pt taking 100/25 losartan/HCTZ once daily as prescribed, no reported side effects.   Reports no changes in diet or exercise with the exception of adding more whole grains to his diet. Drinks at least 60 oz of water every day.    BP Readings from Last 3 Encounters:  11/19/20 130/84  09/23/20 (!) 140/100  11/30/19 (!) 165/122     Past Medical History:  Diagnosis Date   Allergy    Arthritis    Chickenpox    Frequent headaches    Gout    Hypertension    Migraines    Thyroid disease     Past Surgical History:  Procedure Laterality Date   LAMINECTOMY AND MICRODISCECTOMY LUMBAR SPINE  12/2018   MINOR CARPAL TUNNEL  1997    Family History  Problem Relation Age of Onset   Alcohol abuse Mother    Arthritis Mother    COPD Mother    Drug abuse Mother    Early death Mother        untreated COPD, CKD   Kidney disease Mother        On dialysis    Learning disabilities Mother    Alcohol abuse Father    Drug abuse Father    Early death Father        Drowning    Social History   Socioeconomic History   Marital status: Married    Spouse name: Not on file   Number of children: Not on file   Years of education: Not on file   Highest education level: Not on file  Occupational History   Not on file  Tobacco Use   Smoking status: Never   Smokeless tobacco: Never  Vaping Use   Vaping Use: Never used  Substance and Sexual Activity   Alcohol use: Not Currently   Drug use: Never   Sexual activity: Not on file  Other Topics Concern   Not on  file  Social History Narrative   Married.   One child.   Works as a Engineer, production.   Social Determinants of Health   Financial Resource Strain: Not on file  Food Insecurity: Not on file  Transportation Needs: Not on file  Physical Activity: Not on file  Stress: Not on file  Social Connections: Not on file  Intimate Partner Violence: Not on file    Outpatient Medications Prior to Visit  Medication Sig Dispense Refill   allopurinol (ZYLOPRIM) 100 MG tablet TAKE 1 TABLET (100 MG TOTAL) BY MOUTH DAILY. FOR GOUT PREVENTION. 30 tablet 2   cetirizine (ZYRTEC) 10 MG tablet Take 10 mg by mouth daily.     HORIZANT 300 MG TBCR Take 1 tablet by mouth 2 (two) times daily.     levothyroxine (SYNTHROID) 50 MCG tablet TAKE 1 TABLET BY MOUTH EVERY MORNING ON AN EMPTY STOMACH WITH WATER ONLY. NO FOOD/MEDS FOR 30 MINS 30 tablet 0   losartan-hydrochlorothiazide (HYZAAR) 100-25 MG tablet TAKE 1 TABLET BY MOUTH DAILY FOR BLOOD PRESSURE  30 tablet 0   meloxicam (MOBIC) 15 MG tablet TAKE 1 TABLET BY MOUTH EVERY DAY 30 tablet 0   No facility-administered medications prior to visit.    No Known Allergies  ROS Review of Systems  Constitutional: Negative.   HENT:  Positive for postnasal drip.        Seasonal allergies  Respiratory: Negative.    Cardiovascular: Negative.   Gastrointestinal: Negative.   Genitourinary: Negative.   Neurological: Negative.   Psychiatric/Behavioral: Negative.       Objective:    Physical Exam Constitutional:      Appearance: Normal appearance.  Cardiovascular:     Rate and Rhythm: Normal rate and regular rhythm.  Pulmonary:     Effort: Pulmonary effort is normal.     Breath sounds: Normal breath sounds.  Abdominal:     Palpations: Abdomen is soft.  Neurological:     General: No focal deficit present.     Mental Status: He is alert and oriented to person, place, and time.  Psychiatric:        Mood and Affect: Mood normal.    BP 130/84 (BP Location: Left Arm,  Patient Position: Sitting, Cuff Size: Large)   Pulse 78   Temp 97.7 F (36.5 C) (Temporal)   Ht 5\' 7"  (1.702 m)   Wt 277 lb (125.6 kg)   SpO2 98%   BMI 43.38 kg/m  Wt Readings from Last 3 Encounters:  11/19/20 277 lb (125.6 kg)  09/23/20 272 lb 8 oz (123.6 kg)  09/24/19 278 lb (126.1 kg)     Health Maintenance Due  Topic Date Due   HIV Screening  Never done   Hepatitis C Screening  Never done   INFLUENZA VACCINE  10/27/2020    There are no preventive care reminders to display for this patient.  Lab Results  Component Value Date   TSH 6.44 (H) 09/23/2020   No results found for: WBC, HGB, HCT, MCV, PLT Lab Results  Component Value Date   NA 139 09/23/2020   K 4.3 09/23/2020   CO2 34 (H) 09/23/2020   GLUCOSE 108 (H) 09/23/2020   BUN 14 09/23/2020   CREATININE 0.95 09/23/2020   BILITOT 0.6 09/23/2020   ALKPHOS 83 09/23/2020   AST 16 09/23/2020   ALT 23 09/23/2020   PROT 7.1 09/23/2020   ALBUMIN 4.8 09/23/2020   CALCIUM 10.0 09/23/2020   GFR 97.72 09/23/2020   Lab Results  Component Value Date   CHOL 235 (H) 09/23/2020   Lab Results  Component Value Date   HDL 44.80 09/23/2020   No results found for: [facility] Lab Results  Component Value Date   TRIG 275.0 (H) 09/23/2020   Lab Results  Component Value Date   CHOLHDL 5 09/23/2020   Lab Results  Component Value Date   HGBA1C 6.0 09/23/2020      Assessment & Plan:   Problem List Items Addressed This Visit   None   No orders of the defined types were placed in this encounter.   Follow-up: No follow-ups on file.    09/25/2020, RN

## 2020-11-19 NOTE — Assessment & Plan Note (Addendum)
BP improved! Continue Losaran/HCTZ at 100/25 mg daily.  Spot check BP at home  Focus on lifestyle changes, increase activity as tolerated, eat more balanced foods, less processed/high fat and calorie choices    Reviewed, evaluated patient, and agree with assessment and plan developed by Kathaleen Maser, DNP student.

## 2020-11-19 NOTE — Patient Instructions (Signed)
Continue Losartan/HCTZ at current dose, blood pressure was great today! Great to see you today!    Managing Your Hypertension Hypertension, also called high blood pressure, is when the force of the blood pressing against the walls of the arteries is too strong. Arteries are blood vessels that carry blood from your heart throughout your body. Hypertension forces the heart to work harder to pump blood and may cause the arteries tobecome narrow or stiff. Understanding blood pressure readings Your personal target blood pressure may vary depending on your medical conditions, your age, and other factors. A blood pressure reading includes a higher number over a lower number. Ideally, your blood pressure should be below 120/80. You should know that: The first, or top, number is called the systolic pressure. It is a measure of the pressure in your arteries as your heart beats. The second, or bottom number, is called the diastolic pressure. It is a measure of the pressure in your arteries as the heart relaxes. Blood pressure is classified into four stages. Based on your blood pressure reading, your health care provider may use the following stages to determine what type of treatment you need, if any. Systolic pressure and diastolicpressure are measured in a unit called mmHg. Normal Systolic pressure: below 120. Diastolic pressure: below 80. Elevated Systolic pressure: 120-129. Diastolic pressure: below 80. Hypertension stage 1 Systolic pressure: 130-139. Diastolic pressure: 80-89. Hypertension stage 2 Systolic pressure: 140 or above. Diastolic pressure: 90 or above. How can this condition affect me? Managing your hypertension is an important responsibility. Over time, hypertension can damage the arteries and decrease blood flow to important parts of the body, including the brain, heart, and kidneys. Having untreated or uncontrolled hypertension can lead to: A heart attack. A stroke. A weakened  blood vessel (aneurysm). Heart failure. Kidney damage. Eye damage. Metabolic syndrome. Memory and concentration problems. Vascular dementia. What actions can I take to manage this condition? Hypertension can be managed by making lifestyle changes and possibly by taking medicines. Your health care provider will help you make a plan to bring yourblood pressure within a normal range. Nutrition  Eat a diet that is high in fiber and potassium, and low in salt (sodium), added sugar, and fat. An example eating plan is called the Dietary Approaches to Stop Hypertension (DASH) diet. To eat this way: Eat plenty of fresh fruits and vegetables. Try to fill one-half of your plate at each meal with fruits and vegetables. Eat whole grains, such as whole-wheat pasta, brown rice, or whole-grain bread. Fill about one-fourth of your plate with whole grains. Eat low-fat dairy products. Avoid fatty cuts of meat, processed or cured meats, and poultry with skin. Fill about one-fourth of your plate with lean proteins such as fish, chicken without skin, beans, eggs, and tofu. Avoid pre-made and processed foods. These tend to be higher in sodium, added sugar, and fat. Reduce your daily sodium intake. Most people with hypertension should eat less than 1,500 mg of sodium a day.  Lifestyle  Work with your health care provider to maintain a healthy body weight or to lose weight. Ask what an ideal weight is for you. Get at least 30 minutes of exercise that causes your heart to beat faster (aerobic exercise) most days of the week. Activities may include walking, swimming, or biking. Include exercise to strengthen your muscles (resistance exercise), such as weight lifting, as part of your weekly exercise routine. Try to do these types of exercises for 30 minutes at least 3 days  a week. Do not use any products that contain nicotine or tobacco, such as cigarettes, e-cigarettes, and chewing tobacco. If you need help quitting,  ask your health care provider. Control any long-term (chronic) conditions you have, such as high cholesterol or diabetes. Identify your sources of stress and find ways to manage stress. This may include meditation, deep breathing, or making time for fun activities.  Alcohol use Do not drink alcohol if: Your health care provider tells you not to drink. You are pregnant, may be pregnant, or are planning to become pregnant. If you drink alcohol: Limit how much you use to: 0-1 drink a day for women. 0-2 drinks a day for men. Be aware of how much alcohol is in your drink. In the [place] one drink equals one 12 oz bottle of beer (355 mL), one 5 oz glass of wine (148 mL), or one 1 oz glass of hard liquor (44 mL). Medicines Your health care provider may prescribe medicine if lifestyle changes are not enough to get your blood pressure under control and if: Your systolic blood pressure is 130 or higher. Your diastolic blood pressure is 80 or higher. Take medicines only as told by your health care provider. Follow the directions carefully. Blood pressure medicines must be taken as told by your health care provider. The medicine does not work as well when you skip doses. Skippingdoses also puts you at risk for problems. Monitoring Before you monitor your blood pressure: Do not smoke, drink caffeinated beverages, or exercise within 30 minutes before taking a measurement. Use the bathroom and empty your bladder (urinate). Sit quietly for at least 5 minutes before taking measurements. Monitor your blood pressure at home as told by your health care provider. To do this: Sit with your back straight and supported. Place your feet flat on the floor. Do not cross your legs. Support your arm on a flat surface, such as a table. Make sure your upper arm is at heart level. Each time you measure, take two or three readings one minute apart and record the results. You may also need to have your blood pressure  checked regularly by your healthcare provider. General information Talk with your health care provider about your diet, exercise habits, and other lifestyle factors that may be contributing to hypertension. Review all the medicines you take with your health care provider because there may be side effects or interactions. Keep all visits as told by your health care provider. Your health care provider can help you create and adjust your plan for managing your high blood pressure. Where to find more information National Heart, Lung, and Blood Institute: PopSteam.is American Heart Association: www.heart.org Contact a health care provider if: You think you are having a reaction to medicines you have taken. You have repeated (recurrent) headaches. You feel dizzy. You have swelling in your ankles. You have trouble with your vision. Get help right away if: You develop a severe headache or confusion. You have unusual weakness or numbness, or you feel faint. You have severe pain in your chest or abdomen. You vomit repeatedly. You have trouble breathing. These symptoms may represent a serious problem that is an emergency. Do not wait to see if the symptoms will go away. Get medical help right away. Call your local emergency services (911 in the [place] Do not drive yourself to the hospital. Summary Hypertension is when the force of blood pumping through your arteries is too strong. If this condition is not controlled, it may put  you at risk for serious complications. Your personal target blood pressure may vary depending on your medical conditions, your age, and other factors. For most people, a normal blood pressure is less than 120/80. Hypertension is managed by lifestyle changes, medicines, or both. Lifestyle changes to help manage hypertension include losing weight, eating a healthy, low-sodium diet, exercising more, stopping smoking, and limiting alcohol. This information is not intended to  replace advice given to you by your health care provider. Make sure you discuss any questions you have with your healthcare provider. Document Revised: 04/20/2019 Document Reviewed: 02/13/2019 Elsevier Patient Education  2022 Elsevier Inc.   Increase level of exercise as tolerated.

## 2020-11-19 NOTE — Progress Notes (Signed)
Subjective:    Patient ID: Peter Fitzgerald, male    DOB: 1975/09/14, 45 y.o.   MRN: [id number]  HPI  Peter Fitzgerald is a very pleasant 45 y.o. male with a history of hypertension, hypothyroidism, prediabetes, chronic gout who presents today for follow up of hypertension.   He was last evaluated in late June 2022 for CPE, blood pressure was above goal and had been previously despite management on losartan-HCTZ 100-12.5 mg, admitted later some inconsistency with compliance. He was not checking BP at home so he was advised to start checking and update in a few weeks.   He sent a MyChart message several weeks later with BP readings above goal so his regimen was changed to losartan-HCTZ 100-25 mg. He was asked to come in today.  Since his last visit he is compliant to his losartan-HCTZ 100-25 mg. His dizziness has abated. He's not checking BP at home regularly. He is eating more whole grains, is drinking at least 60 oz of water daily. He is feeling well, has no complaints today.  BP Readings from Last 3 Encounters:  11/19/20 130/84  09/23/20 (!) 140/100  11/30/19 (!) 165/122      Review of Systems  Eyes:  Negative for visual disturbance.  Respiratory:  Negative for shortness of breath.   Cardiovascular:  Negative for chest pain.  Neurological:  Negative for dizziness and headaches.        Past Medical History:  Diagnosis Date   Allergy    Arthritis    Chickenpox    Frequent headaches    Gout    Hypertension    Migraines    Thyroid disease     Social History   Socioeconomic History   Marital status: Married    Spouse name: Not on file   Number of children: Not on file   Years of education: Not on file   Highest education level: Not on file  Occupational History   Not on file  Tobacco Use   Smoking status: Never   Smokeless tobacco: Never  Vaping Use   Vaping Use: Never used  Substance and Sexual Activity   Alcohol use: Not Currently   Drug use: Never   Sexual activity:  Not on file  Other Topics Concern   Not on file  Social History Narrative   Married.   One child.   Works as a Engineer, production.   Social Determinants of Health   Financial Resource Strain: Not on file  Food Insecurity: Not on file  Transportation Needs: Not on file  Physical Activity: Not on file  Stress: Not on file  Social Connections: Not on file  Intimate Partner Violence: Not on file    Past Surgical History:  Procedure Laterality Date   LAMINECTOMY AND MICRODISCECTOMY LUMBAR SPINE  12/2018   MINOR CARPAL TUNNEL  1997    Family History  Problem Relation Age of Onset   Alcohol abuse Mother    Arthritis Mother    COPD Mother    Drug abuse Mother    Early death Mother        untreated COPD, CKD   Kidney disease Mother        On dialysis    Learning disabilities Mother    Alcohol abuse Father    Drug abuse Father    Early death Father        Drowning    No Known Allergies  Current Outpatient Medications on File Prior to Visit  Medication Sig Dispense  Refill   allopurinol (ZYLOPRIM) 100 MG tablet TAKE 1 TABLET (100 MG TOTAL) BY MOUTH DAILY. FOR GOUT PREVENTION. 30 tablet 2   cetirizine (ZYRTEC) 10 MG tablet Take 10 mg by mouth daily.     HORIZANT 300 MG TBCR Take 1 tablet by mouth 2 (two) times daily.     levothyroxine (SYNTHROID) 50 MCG tablet TAKE 1 TABLET BY MOUTH EVERY MORNING ON AN EMPTY STOMACH WITH WATER ONLY. NO FOOD/MEDS FOR 30 MINS 30 tablet 0   losartan-hydrochlorothiazide (HYZAAR) 100-25 MG tablet TAKE 1 TABLET BY MOUTH DAILY FOR BLOOD PRESSURE 30 tablet 0   meloxicam (MOBIC) 15 MG tablet TAKE 1 TABLET BY MOUTH EVERY DAY 30 tablet 0   No current facility-administered medications on file prior to visit.    BP 130/84 (BP Location: Left Arm, Patient Position: Sitting, Cuff Size: Large)   Pulse 78   Temp 97.7 F (36.5 C) (Temporal)   Ht 5\' 7"  (1.702 m)   Wt 277 lb (125.6 kg)   SpO2 98%   BMI 43.38 kg/m  Objective:   Physical Exam Cardiovascular:      Rate and Rhythm: Normal rate and regular rhythm.  Pulmonary:     Effort: Pulmonary effort is normal.     Breath sounds: Normal breath sounds. No wheezing or rales.  Musculoskeletal:     Cervical back: Neck supple.  Skin:    General: Skin is warm and dry.  Neurological:     Mental Status: He is alert and oriented to person, place, and time.          Assessment & Plan:      This visit occurred during the SARS-CoV-2 public health emergency.  Safety protocols were in place, including screening questions prior to the visit, additional usage of staff PPE, and extensive cleaning of exam room while observing appropriate contact time as indicated for disinfecting solutions.

## 2020-11-29 ENCOUNTER — Other Ambulatory Visit: Payer: Self-pay | Admitting: Primary Care

## 2020-11-29 DIAGNOSIS — E039 Hypothyroidism, unspecified: Secondary | ICD-10-CM

## 2020-11-30 ENCOUNTER — Other Ambulatory Visit: Payer: Self-pay | Admitting: Primary Care

## 2020-11-30 DIAGNOSIS — E039 Hypothyroidism, unspecified: Secondary | ICD-10-CM

## 2020-12-08 ENCOUNTER — Other Ambulatory Visit: Payer: No Typology Code available for payment source

## 2020-12-29 ENCOUNTER — Other Ambulatory Visit: Payer: Self-pay | Admitting: Primary Care

## 2020-12-29 DIAGNOSIS — E039 Hypothyroidism, unspecified: Secondary | ICD-10-CM

## 2020-12-30 NOTE — Telephone Encounter (Signed)
He is overdue to have TSH rechecked. Looks like he's cancelled this a few times.  Can we get this done please?

## 2020-12-31 NOTE — Telephone Encounter (Signed)
Pt returning call

## 2020-12-31 NOTE — Telephone Encounter (Signed)
Left message to return call to our office.  

## 2021-01-02 NOTE — Telephone Encounter (Signed)
Called and let patient know information. Have made lab for Monday.

## 2021-01-05 ENCOUNTER — Other Ambulatory Visit: Payer: Self-pay

## 2021-01-05 ENCOUNTER — Other Ambulatory Visit (INDEPENDENT_AMBULATORY_CARE_PROVIDER_SITE_OTHER): Payer: No Typology Code available for payment source

## 2021-01-05 DIAGNOSIS — E039 Hypothyroidism, unspecified: Secondary | ICD-10-CM | POA: Diagnosis not present

## 2021-01-05 LAB — TSH: TSH: 4.92 u[IU]/mL (ref 0.35–5.50)

## 2021-01-07 DIAGNOSIS — E039 Hypothyroidism, unspecified: Secondary | ICD-10-CM

## 2021-01-08 MED ORDER — LEVOTHYROXINE SODIUM 75 MCG PO TABS: ORAL_TABLET | ORAL | 0 refills | Status: DC

## 2021-02-25 ENCOUNTER — Telehealth: Payer: No Typology Code available for payment source

## 2021-02-25 ENCOUNTER — Encounter: Payer: Self-pay | Admitting: Emergency Medicine

## 2021-02-25 ENCOUNTER — Ambulatory Visit
Admission: EM | Admit: 2021-02-25 | Discharge: 2021-02-25 | Disposition: A | Discharge status: Home / Self Care | Payer: No Typology Code available for payment source | Attending: Emergency Medicine | Admitting: Emergency Medicine

## 2021-02-25 DIAGNOSIS — Z1152 Encounter for screening for COVID-19: Secondary | ICD-10-CM

## 2021-02-25 DIAGNOSIS — J069 Acute upper respiratory infection, unspecified: Secondary | ICD-10-CM

## 2021-02-25 MED ORDER — FLUTICASONE PROPIONATE 50 MCG/ACT NA SUSP: 2.0000 | Freq: Every day | NASAL | 0 refills | Status: AC

## 2021-02-25 MED ORDER — HYDROCOD POLST-CPM POLST ER 10-8 MG/5ML PO SUER
5.0000 mL | Freq: Two times a day (BID) | ORAL | 0 refills | Status: DC | PRN
Start: 2021-02-25 — End: 2021-09-24

## 2021-02-25 MED ORDER — BENZONATATE 200 MG PO CAPS: 200.0000 mg | ORAL_CAPSULE | Freq: Three times a day (TID) | ORAL | 0 refills | Status: DC | PRN

## 2021-02-25 NOTE — ED Provider Notes (Signed)
HPI  SUBJECTIVE:  Peter Fitzgerald is a 45 y.o. male who presents with 2 days of fevers T-max 100.3, body aches, headaches, nasal congestion, sinus pain and pressure, sinus pain and pressure postnasal drip, sore throat.  These have largely resolved, but he reports a continued painful cough that is keeping him up at night.  He reports nausea the first day, but this has resolved.  No vomiting, diarrhea, abdominal pain, wheezing, shortness of breath, loss of sense of smell or taste.  No GERD symptoms.  He took an antipyretic in the past 6 hours.  No antibiotics in the past 3 months.  No known COVID or flu exposure.  He did not get the COVID or flu vaccines.  He has tried Delsym multisymptom without improvement in his symptoms.  No aggravating or alleviating factors.  He has a past medical history of COVID, hypertension, prediabetes, chronic back pain, hypothyroidism.  OAC:ZYSAY, Keane Scrape, NP   Past Medical History:  Diagnosis Date   Allergy    Arthritis    Chickenpox    Frequent headaches    Gout    Hypertension    Migraines    Thyroid disease     Past Surgical History:  Procedure Laterality Date   LAMINECTOMY AND MICRODISCECTOMY LUMBAR SPINE  12/2018   MINOR CARPAL TUNNEL  1997    Family History  Problem Relation Age of Onset   Alcohol abuse Mother    Arthritis Mother    COPD Mother    Drug abuse Mother    Early death Mother        untreated COPD, CKD   Kidney disease Mother        On dialysis    Learning disabilities Mother    Alcohol abuse Father    Drug abuse Father    Early death Father        Drowning    Social History   Tobacco Use   Smoking status: Never   Smokeless tobacco: Never  Vaping Use   Vaping Use: Never used  Substance Use Topics   Alcohol use: Not Currently   Drug use: Never    No current facility-administered medications for this encounter.  Current Outpatient Medications:    benzonatate (TESSALON) 200 MG capsule, Take 1 capsule (200 mg total) by  mouth 3 (three) times daily as needed for cough., Disp: 30 capsule, Rfl: 0   chlorpheniramine-HYDROcodone (TUSSIONEX PENNKINETIC ER) 10-8 MG/5ML SUER, Take 5 mLs by mouth every 12 (twelve) hours as needed for cough., Disp: 60 mL, Rfl: 0   fluticasone (FLONASE) 50 MCG/ACT nasal spray, Place 2 sprays into both nostrils daily., Disp: 16 g, Rfl: 0   allopurinol (ZYLOPRIM) 100 MG tablet, TAKE 1 TABLET (100 MG TOTAL) BY MOUTH DAILY. FOR GOUT PREVENTION., Disp: 30 tablet, Rfl: 2   cetirizine (ZYRTEC) 10 MG tablet, Take 10 mg by mouth daily., Disp: , Rfl:    HORIZANT 300 MG TBCR, Take 1 tablet by mouth 2 (two) times daily., Disp: , Rfl:    levothyroxine (SYNTHROID) 75 MCG tablet, Take 1 tablet by mouth every morning on an empty stomach with water only.  No food or other medications for 30 minutes., Disp: 90 tablet, Rfl: 0   losartan-hydrochlorothiazide (HYZAAR) 100-25 MG tablet, Take 1 tablet by mouth daily. For blood pressure., Disp: 90 tablet, Rfl: 2   meloxicam (MOBIC) 15 MG tablet, TAKE 1 TABLET BY MOUTH EVERY DAY, Disp: 30 tablet, Rfl: 0  No Known Allergies   ROS  As  noted in HPI.   Physical Exam  BP (!) 133/93   Pulse 78   Temp 98.8 F (37.1 C) (Oral)   Resp 20   SpO2 98%   Constitutional: Well developed, well nourished, no acute distress Eyes:  EOMI, conjunctiva normal bilaterally no nasal congestion.  No maxillary, frontal sinus tenderness.  Unable to generalize oropharynx. HENT: Normocephalic, atraumatic,mucus membranes moist.   Respiratory: Normal inspiratory effort. Coughing.  Lungs clear bilaterally.  No anterior, lateral chest wall tenderness Cardiovascular: Normal rate, regular rhythm, no murmurs rubs or gallops GI: nondistended skin: No rash, skin intact Musculoskeletal: no deformities Neurologic: Alert & oriented x 3, no focal neuro deficits Psychiatric: Speech and behavior appropriate   ED Course   Medications - No data to display  Orders Placed This Encounter   Procedures   Covid-19, Flu A+B ([facility])    Standing Status:   Standing    Number of Occurrences:   1    No results found for this or any previous visit (from the past 24 hour(s)). No results found.  ED Clinical Impression  1. Viral upper respiratory tract infection with cough   2. Encounter for screening for COVID-19      ED Assessment/Plan  Unable to review [facility] narcotic database due to technical errors on the database.   COVID, flu sent.  If COVID is positive, will prescribe Molnupiravir.  If influenza is positive, will prescribe Tamiflu because he is at increased risk for adverse outcomes from either 1 of these.  Phone number [phone].  Not prescribed antivirals today.  In the meantime, supportive treatment-Tussionex, Flonase, saline nasal irrigation, add Tylenol to the meloxicam and Tessalon.  Follow-up with PMD as needed.   COVID, flu pending at the time of initial signing of this note.  Discussed MDM, treatment plan, and plan for follow-up with patient. patient agrees with plan.   Meds ordered this encounter  Medications   fluticasone (FLONASE) 50 MCG/ACT nasal spray    Sig: Place 2 sprays into both nostrils daily.    Dispense:  16 g    Refill:  0   benzonatate (TESSALON) 200 MG capsule    Sig: Take 1 capsule (200 mg total) by mouth 3 (three) times daily as needed for cough.    Dispense:  30 capsule    Refill:  0   chlorpheniramine-HYDROcodone (TUSSIONEX PENNKINETIC ER) 10-8 MG/5ML SUER    Sig: Take 5 mLs by mouth every 12 (twelve) hours as needed for cough.    Dispense:  60 mL    Refill:  0      *This clinic note was created using Scientist, clinical (histocompatibility and immunogenetics). Therefore, there may be occasional mistakes despite careful proofreading.  ?    Domenick Gong, MD 02/25/21 1019

## 2021-02-25 NOTE — Discharge Instructions (Addendum)
If COVID is positive, I will prescribe Molnupiravir.  If influenza is positive, will prescribe Tamiflu. Tussionex for the cough at night, or if it becomes severe, Flonase, saline nasal irrigation with a NeilMed sinus rinse and distilled water as often as you want, add Tylenol to the meloxicam and Tessalon for the cough during the day.  Follow-up with PMD as needed

## 2021-02-25 NOTE — ED Triage Notes (Signed)
Pt here with flu-like sx x 2 days. Pt had fever on Monday, but has not had one since.

## 2021-02-26 LAB — COVID-19, FLU A+B NAA
Influenza A, NAA: DETECTED — AB
Influenza B, NAA: NOT DETECTED
SARS-CoV-2, NAA: NOT DETECTED

## 2021-03-27 ENCOUNTER — Other Ambulatory Visit: Payer: Self-pay

## 2021-03-27 ENCOUNTER — Other Ambulatory Visit (INDEPENDENT_AMBULATORY_CARE_PROVIDER_SITE_OTHER): Payer: No Typology Code available for payment source

## 2021-03-27 DIAGNOSIS — E039 Hypothyroidism, unspecified: Secondary | ICD-10-CM | POA: Diagnosis not present

## 2021-03-27 LAB — TSH: TSH: 5.6 u[IU]/mL — ABNORMAL HIGH (ref 0.35–5.50)

## 2021-03-29 MED ORDER — LEVOTHYROXINE SODIUM 100 MCG PO TABS: ORAL_TABLET | ORAL | 0 refills | Status: DC

## 2021-04-10 ENCOUNTER — Other Ambulatory Visit: Payer: Self-pay | Admitting: Primary Care

## 2021-04-10 DIAGNOSIS — E039 Hypothyroidism, unspecified: Secondary | ICD-10-CM

## 2021-05-19 ENCOUNTER — Other Ambulatory Visit: Payer: Self-pay | Admitting: Primary Care

## 2021-05-19 DIAGNOSIS — E039 Hypothyroidism, unspecified: Secondary | ICD-10-CM

## 2021-05-25 ENCOUNTER — Other Ambulatory Visit: Payer: Self-pay

## 2021-05-25 ENCOUNTER — Other Ambulatory Visit (INDEPENDENT_AMBULATORY_CARE_PROVIDER_SITE_OTHER): Payer: No Typology Code available for payment source

## 2021-05-25 ENCOUNTER — Other Ambulatory Visit: Payer: No Typology Code available for payment source

## 2021-05-25 DIAGNOSIS — E039 Hypothyroidism, unspecified: Secondary | ICD-10-CM | POA: Diagnosis not present

## 2021-05-26 LAB — TSH: TSH: 2.92 u[IU]/mL (ref 0.35–5.50)

## 2021-06-27 ENCOUNTER — Other Ambulatory Visit: Payer: Self-pay | Admitting: Primary Care

## 2021-06-27 DIAGNOSIS — E039 Hypothyroidism, unspecified: Secondary | ICD-10-CM

## 2021-09-24 ENCOUNTER — Ambulatory Visit (INDEPENDENT_AMBULATORY_CARE_PROVIDER_SITE_OTHER): Payer: No Typology Code available for payment source | Admitting: Primary Care

## 2021-09-24 VITALS — BP 140/90 | HR 85 | Temp 97.9°F | Ht 67.0 in | Wt 292.0 lb

## 2021-09-24 DIAGNOSIS — M5136 Other intervertebral disc degeneration, lumbar region: Secondary | ICD-10-CM

## 2021-09-24 DIAGNOSIS — E039 Hypothyroidism, unspecified: Secondary | ICD-10-CM

## 2021-09-24 DIAGNOSIS — Z136 Encounter for screening for cardiovascular disorders: Secondary | ICD-10-CM | POA: Diagnosis not present

## 2021-09-24 DIAGNOSIS — M51369 Other intervertebral disc degeneration, lumbar region without mention of lumbar back pain or lower extremity pain: Secondary | ICD-10-CM

## 2021-09-24 DIAGNOSIS — R7303 Prediabetes: Secondary | ICD-10-CM

## 2021-09-24 DIAGNOSIS — M546 Pain in thoracic spine: Secondary | ICD-10-CM

## 2021-09-24 DIAGNOSIS — I10 Essential (primary) hypertension: Secondary | ICD-10-CM

## 2021-09-24 DIAGNOSIS — G8929 Other chronic pain: Secondary | ICD-10-CM

## 2021-09-24 DIAGNOSIS — M1A9XX0 Chronic gout, unspecified, without tophus (tophi): Secondary | ICD-10-CM | POA: Diagnosis not present

## 2021-09-24 DIAGNOSIS — Z0000 Encounter for general adult medical examination without abnormal findings: Secondary | ICD-10-CM | POA: Diagnosis not present

## 2021-09-24 LAB — LIPID PANEL
Cholesterol: 200 mg/dL (ref 0–200)
HDL: 42.7 mg/dL (ref >39.00)
NonHDL: 156.81
Total CHOL/HDL Ratio: 5
Triglycerides: 232 mg/dL — ABNORMAL HIGH (ref 0.0–149.0)
VLDL: 46.4 mg/dL — ABNORMAL HIGH (ref 0.0–40.0)

## 2021-09-24 LAB — COMPREHENSIVE METABOLIC PANEL
ALT: 29 U/L (ref 0–53)
AST: 20 U/L (ref 0–37)
Albumin: 4.7 g/dL (ref 3.5–5.2)
Alkaline Phosphatase: 67 U/L (ref 39–117)
BUN: 14 mg/dL (ref 6–23)
CO2: 32 mEq/L (ref 19–32)
Calcium: 9.7 mg/dL (ref 8.4–10.5)
Chloride: 99 mEq/L (ref 96–112)
Creatinine, Ser: 0.95 mg/dL (ref 0.40–1.50)
GFR: 97.03 mL/min (ref >60.00)
Glucose, Bld: 126 mg/dL — ABNORMAL HIGH (ref 70–99)
Potassium: 4.3 mEq/L (ref 3.5–5.1)
Sodium: 139 mEq/L (ref 135–145)
Total Bilirubin: 0.5 mg/dL (ref 0.2–1.2)
Total Protein: 6.8 g/dL (ref 6.0–8.3)

## 2021-09-24 LAB — HEMOGLOBIN A1C: Hgb A1c MFr Bld: 6.2 % (ref 4.6–6.5)

## 2021-09-24 LAB — CBC
HCT: 39.8 % (ref 39.0–52.0)
Hemoglobin: 13.1 g/dL (ref 13.0–17.0)
MCHC: 33 g/dL (ref 30.0–36.0)
MCV: 92.7 fl (ref 78.0–100.0)
Platelets: 305 10*3/uL (ref 150.0–400.0)
RBC: 4.3 Mil/uL (ref 4.22–5.81)
RDW: 13.2 % (ref 11.5–15.5)
WBC: 6.4 10*3/uL (ref 4.0–10.5)

## 2021-09-24 LAB — LDL CHOLESTEROL, DIRECT: Direct LDL: 128 mg/dL

## 2021-09-24 LAB — TSH: TSH: 7.68 u[IU]/mL — ABNORMAL HIGH (ref 0.35–5.50)

## 2021-09-24 NOTE — Assessment & Plan Note (Signed)
Stable.  Continue Meloxicam 15 mg daily. Following with rheumatology.

## 2021-09-24 NOTE — Assessment & Plan Note (Signed)
He is taking levothyroxine correctly.  Continue levothyroxine 100 mcg daily.  Repeat TSH pending. 

## 2021-09-24 NOTE — Progress Notes (Signed)
Subjective:    Patient ID: Peter Fitzgerald, male    DOB: September 20, 1975, 46 y.o.   MRN: [id number]  HPI  Peter Fitzgerald is a very pleasant 46 y.o. male who presents today for complete physical and follow up of chronic conditions.  Immunizations: -Tetanus: 2022 -Influenza: Did not complete last season -Covid-19: Has not completed  Diet: Fair diet.  Exercise: No regular exercise. Active at work.   Eye exam: Completes every 2 years.   Dental exam: Completes semi-annually   BP Readings from Last 3 Encounters:  09/24/21 140/90  02/25/21 (!) 133/93  11/19/20 130/84        Review of Systems  Constitutional:  Negative for unexpected weight change.  HENT:  Negative for rhinorrhea.   Respiratory:  Negative for cough and shortness of breath.   Cardiovascular:  Negative for chest pain.  Gastrointestinal:  Negative for constipation and diarrhea.  Genitourinary:  Negative for difficulty urinating.  Musculoskeletal:  Positive for arthralgias and back pain.  Skin:  Negative for rash.  Allergic/Immunologic: Negative for environmental allergies.  Neurological:  Negative for dizziness and headaches.  Psychiatric/Behavioral:  The patient is not nervous/anxious.          Past Medical History:  Diagnosis Date   Allergy    Arthritis    Chickenpox    Frequent headaches    Gout    Hypertension    Migraines    Thyroid disease     Social History   Socioeconomic History   Marital status: Married    Spouse name: Not on file   Number of children: Not on file   Years of education: Not on file   Highest education level: Not on file  Occupational History   Not on file  Tobacco Use   Smoking status: Never   Smokeless tobacco: Never  Vaping Use   Vaping Use: Never used  Substance and Sexual Activity   Alcohol use: Not Currently   Drug use: Never   Sexual activity: Not on file  Other Topics Concern   Not on file  Social History Narrative   Married.   One child.   Works as a Engineer, production.    Social Determinants of Health   Financial Resource Strain: Not on file  Food Insecurity: Not on file  Transportation Needs: Not on file  Physical Activity: Not on file  Stress: Not on file  Social Connections: Not on file  Intimate Partner Violence: Not on file    Past Surgical History:  Procedure Laterality Date   LAMINECTOMY AND MICRODISCECTOMY LUMBAR SPINE  12/2018   MINOR CARPAL TUNNEL  1997    Family History  Problem Relation Age of Onset   Alcohol abuse Mother    Arthritis Mother    COPD Mother    Drug abuse Mother    Early death Mother        untreated COPD, CKD   Kidney disease Mother        On dialysis    Learning disabilities Mother    Alcohol abuse Father    Drug abuse Father    Early death Father        Drowning    Allergies  Allergen Reactions   Colchicine Other (See Comments)    Current Outpatient Medications on File Prior to Visit  Medication Sig Dispense Refill   allopurinol (ZYLOPRIM) 300 MG tablet      cetirizine (ZYRTEC) 10 MG tablet Take 10 mg by mouth daily.     fluticasone (  FLONASE) 50 MCG/ACT nasal spray Place 2 sprays into both nostrils daily. 16 g 0   levothyroxine (SYNTHROID) 100 MCG tablet Take 1 tablet by mouth every morning on an empty stomach with water only.  No food or other medications for 30 minutes. 90 tablet 0   losartan-hydrochlorothiazide (HYZAAR) 100-25 MG tablet Take 1 tablet by mouth daily. For blood pressure. 90 tablet 2   meloxicam (MOBIC) 15 MG tablet TAKE 1 TABLET BY MOUTH EVERY DAY 30 tablet 0   No current facility-administered medications on file prior to visit.    BP 140/90   Pulse 85   Temp 97.9 F (36.6 C) (Oral)   Ht 5\' 7"  (1.702 m)   Wt 292 lb (132.5 kg)   SpO2 97%   BMI 45.73 kg/m  Objective:   Physical Exam HENT:     Right Ear: Tympanic membrane and ear canal normal.     Left Ear: Tympanic membrane and ear canal normal.     Nose: Nose normal.     Right Sinus: No maxillary sinus tenderness or  frontal sinus tenderness.     Left Sinus: No maxillary sinus tenderness or frontal sinus tenderness.  Eyes:     Conjunctiva/sclera: Conjunctivae normal.  Neck:     Thyroid: No thyromegaly.     Vascular: No carotid bruit.  Cardiovascular:     Rate and Rhythm: Normal rate and regular rhythm.     Heart sounds: Normal heart sounds.  Pulmonary:     Effort: Pulmonary effort is normal.     Breath sounds: Normal breath sounds. No wheezing or rales.  Abdominal:     General: Bowel sounds are normal.     Palpations: Abdomen is soft.     Tenderness: There is no abdominal tenderness.  Musculoskeletal:        General: Normal range of motion.     Cervical back: Neck supple.  Skin:    General: Skin is warm and dry.  Neurological:     Mental Status: He is alert and oriented to person, place, and time.     Cranial Nerves: No cranial nerve deficit.     Deep Tendon Reflexes: Reflexes are normal and symmetric.  Psychiatric:        Mood and Affect: Mood normal.           Assessment & Plan:   Problem List Items Addressed This Visit       Cardiovascular and Mediastinum   Essential hypertension    Above goal today, also on recheck. He does endorse "white coat syndrome".  I've asked him to monitor BP at home and notify if readings are consistently at or above 135/90. Continue losartan-HCTZ 100-25 mg daily.  CMP pending.        Endocrine   Hypothyroidism    He is taking levothyroxine correctly. Continue levothyroxine 100 mcg daily. Repeat TSH pending.      Relevant Orders   TSH   Comprehensive metabolic panel   CBC     Musculoskeletal and Integument   DDD (degenerative disc disease), lumbar    Stable.  Continue Meloxicam 15 mg daily. Encouraged weight loss.  Renal function pending.      Relevant Medications   allopurinol (ZYLOPRIM) 300 MG tablet     Other   Chronic midline thoracic back pain    Stable.  Continue Meloxicam 15 mg daily. Following with  rheumatology.       Prediabetes    Discussed the importance of a healthy diet  and regular exercise in order for weight loss, and to reduce the risk of further co-morbidity.  Repeat A1C pending.      Relevant Orders   Lipid panel   Hemoglobin A1c   Comprehensive metabolic panel   CBC   Preventative health care - Primary    Immunizations UTD.  Discussed the importance of a healthy diet and regular exercise in order for weight loss, and to reduce the risk of further co-morbidity.  Exam stable. Labs pending.  Follow up in 1 year for repeat physical.       Chronic gout    No recent flares.  Following with rheumatology. Continue allopurinol 300 mg and Meloxicam 15 mg daily.  Renal function pending today.           Doreene Nest, NP

## 2021-09-24 NOTE — Assessment & Plan Note (Signed)
Above goal today, also on recheck. He does endorse "white coat syndrome".  I've asked him to monitor BP at home and notify if readings are consistently at or above 135/90. Continue losartan-HCTZ 100-25 mg daily.  CMP pending.

## 2021-09-24 NOTE — Assessment & Plan Note (Signed)
Immunizations UTD.  Discussed the importance of a healthy diet and regular exercise in order for weight loss, and to reduce the risk of further co-morbidity.  Exam stable. Labs pending.  Follow up in 1 year for repeat physical.  

## 2021-09-24 NOTE — Assessment & Plan Note (Signed)
Stable.  Continue Meloxicam 15 mg daily. Encouraged weight loss.  Renal function pending.

## 2021-09-24 NOTE — Assessment & Plan Note (Signed)
Discussed the importance of a healthy diet and regular exercise in order for weight loss, and to reduce the risk of further co-morbidity. ? ?Repeat A1C pending. ?

## 2021-09-24 NOTE — Patient Instructions (Signed)
Stop by the lab prior to leaving today. I will notify you of your results once received.   It was a pleasure to see you today!  Preventive Care 40-45 Years Old, Male Preventive care refers to lifestyle choices and visits with your health care provider that can promote health and wellness. Preventive care visits are also called wellness exams. What can I expect for my preventive care visit? Counseling During your preventive care visit, your health care provider may ask about your: Medical history, including: Past medical problems. Family medical history. Current health, including: Emotional well-being. Home life and relationship well-being. Sexual activity. Lifestyle, including: Alcohol, nicotine or tobacco, and drug use. Access to firearms. Diet, exercise, and sleep habits. Safety issues such as seatbelt and bike helmet use. Sunscreen use. Work and work environment. Physical exam Your health care provider will check your: Height and weight. These may be used to calculate your BMI (body mass index). BMI is a measurement that tells if you are at a healthy weight. Waist circumference. This measures the distance around your waistline. This measurement also tells if you are at a healthy weight and may help predict your risk of certain diseases, such as type 2 diabetes and high blood pressure. Heart rate and blood pressure. Body temperature. Skin for abnormal spots. What immunizations do I need?  Vaccines are usually given at various ages, according to a schedule. Your health care provider will recommend vaccines for you based on your age, medical history, and lifestyle or other factors, such as travel or where you work. What tests do I need? Screening Your health care provider may recommend screening tests for certain conditions. This may include: Lipid and cholesterol levels. Diabetes screening. This is done by checking your blood sugar (glucose) after you have not eaten for a while  (fasting). Hepatitis B test. Hepatitis C test. HIV (human immunodeficiency virus) test. STI (sexually transmitted infection) testing, if you are at risk. Lung cancer screening. Prostate cancer screening. Colorectal cancer screening. Talk with your health care provider about your test results, treatment options, and if necessary, the need for more tests. Follow these instructions at home: Eating and drinking  Eat a diet that includes fresh fruits and vegetables, whole grains, lean protein, and low-fat dairy products. Take vitamin and mineral supplements as recommended by your health care provider. Do not drink alcohol if your health care provider tells you not to drink. If you drink alcohol: Limit how much you have to 0-2 drinks a day. Know how much alcohol is in your drink. In the [place] one drink equals one 12 oz bottle of beer (355 mL), one 5 oz glass of wine (148 mL), or one 1 oz glass of hard liquor (44 mL). Lifestyle Brush your teeth every morning and night with fluoride toothpaste. Floss one time each day. Exercise for at least 30 minutes 5 or more days each week. Do not use any products that contain nicotine or tobacco. These products include cigarettes, chewing tobacco, and vaping devices, such as e-cigarettes. If you need help quitting, ask your health care provider. Do not use drugs. If you are sexually active, practice safe sex. Use a condom or other form of protection to prevent STIs. Take aspirin only as told by your health care provider. Make sure that you understand how much to take and what form to take. Work with your health care provider to find out whether it is safe and beneficial for you to take aspirin daily. Find healthy ways to manage   stress, such as: Meditation, yoga, or listening to music. Journaling. Talking to a trusted person. Spending time with friends and family. Minimize exposure to UV radiation to reduce your risk of skin cancer. Safety Always wear  your seat belt while driving or riding in a vehicle. Do not drive: If you have been drinking alcohol. Do not ride with someone who has been drinking. When you are tired or distracted. While texting. If you have been using any mind-altering substances or drugs. Wear a helmet and other protective equipment during sports activities. If you have firearms in your house, make sure you follow all gun safety procedures. What's next? Go to your health care provider once a year for an annual wellness visit. Ask your health care provider how often you should have your eyes and teeth checked. Stay up to date on all vaccines. This information is not intended to replace advice given to you by your health care provider. Make sure you discuss any questions you have with your health care provider. Document Revised: 09/10/2020 Document Reviewed: 09/10/2020 Elsevier Patient Education  2023 [employer]  

## 2021-09-24 NOTE — Assessment & Plan Note (Signed)
No recent flares.  Following with rheumatology. Continue allopurinol 300 mg and Meloxicam 15 mg daily.  Renal function pending today.

## 2021-09-26 ENCOUNTER — Other Ambulatory Visit: Payer: Self-pay | Admitting: Primary Care

## 2021-09-26 DIAGNOSIS — E039 Hypothyroidism, unspecified: Secondary | ICD-10-CM

## 2021-10-02 ENCOUNTER — Other Ambulatory Visit (INDEPENDENT_AMBULATORY_CARE_PROVIDER_SITE_OTHER): Payer: No Typology Code available for payment source

## 2021-10-02 DIAGNOSIS — E039 Hypothyroidism, unspecified: Secondary | ICD-10-CM

## 2021-10-02 LAB — T4, FREE: Free T4: 0.77 ng/dL (ref 0.60–1.60)

## 2021-10-02 LAB — T3, FREE: T3, Free: 3.5 pg/mL (ref 2.3–4.2)

## 2021-10-02 LAB — TSH: TSH: 4.08 u[IU]/mL (ref 0.35–5.50)

## 2021-10-03 DIAGNOSIS — E039 Hypothyroidism, unspecified: Secondary | ICD-10-CM

## 2021-10-06 MED ORDER — LEVOTHYROXINE SODIUM 112 MCG PO TABS: ORAL_TABLET | ORAL | 0 refills | Status: DC

## 2021-10-09 ENCOUNTER — Other Ambulatory Visit: Payer: Self-pay | Admitting: Neurosurgery

## 2021-10-09 DIAGNOSIS — M51369 Other intervertebral disc degeneration, lumbar region without mention of lumbar back pain or lower extremity pain: Secondary | ICD-10-CM

## 2021-10-09 DIAGNOSIS — M5136 Other intervertebral disc degeneration, lumbar region: Secondary | ICD-10-CM

## 2021-10-09 DIAGNOSIS — M546 Pain in thoracic spine: Secondary | ICD-10-CM

## 2021-10-22 ENCOUNTER — Other Ambulatory Visit: Payer: No Typology Code available for payment source

## 2021-11-03 DIAGNOSIS — M546 Pain in thoracic spine: Secondary | ICD-10-CM | POA: Diagnosis not present

## 2021-11-03 DIAGNOSIS — M5459 Other low back pain: Secondary | ICD-10-CM | POA: Diagnosis not present

## 2021-11-13 ENCOUNTER — Other Ambulatory Visit: Payer: Self-pay | Admitting: Primary Care

## 2021-11-13 DIAGNOSIS — I10 Essential (primary) hypertension: Secondary | ICD-10-CM

## 2021-11-24 ENCOUNTER — Encounter: Payer: No Typology Code available for payment source | Admitting: Primary Care

## 2021-11-25 DIAGNOSIS — M5416 Radiculopathy, lumbar region: Secondary | ICD-10-CM | POA: Diagnosis not present

## 2021-12-08 IMAGING — DX DG THORACIC SPINE 3V
3 series · 3 of 3 positions shown · non-contrast
Comparison: None.

CLINICAL DATA: Acute on chronic back pain

EXAM:
THORACIC SPINE - 3 VIEWS

[t-spine ap]
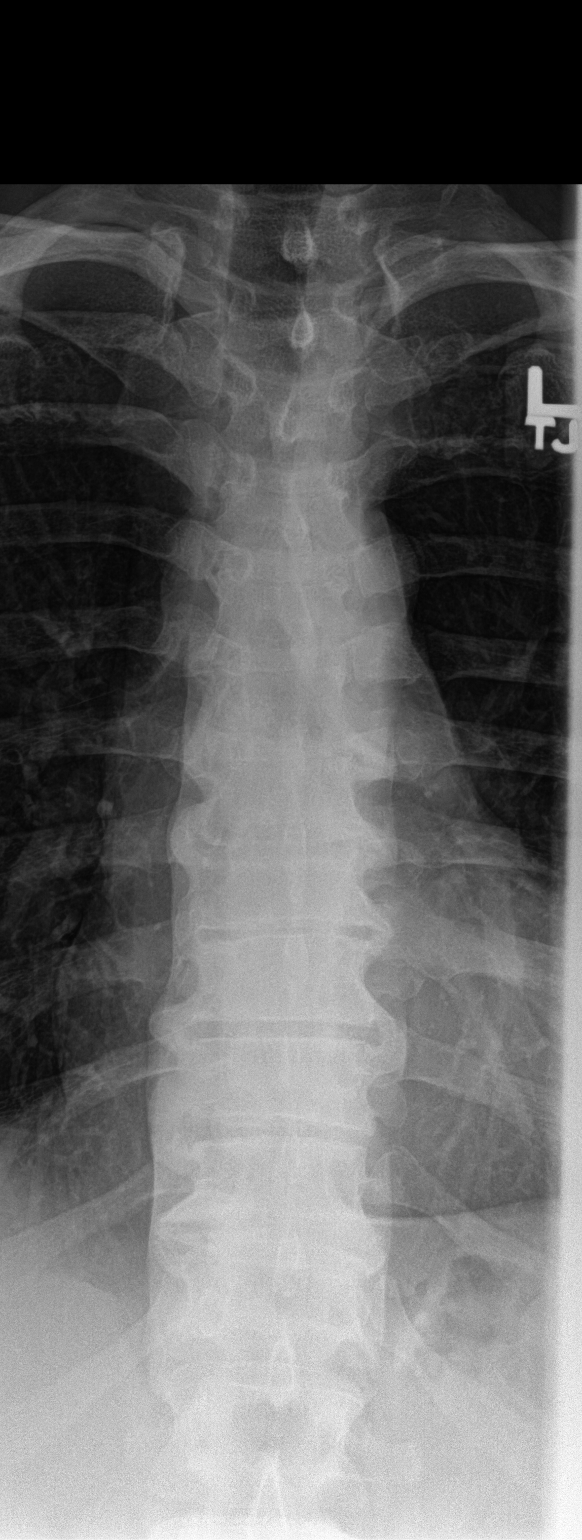

[t-spine lat]
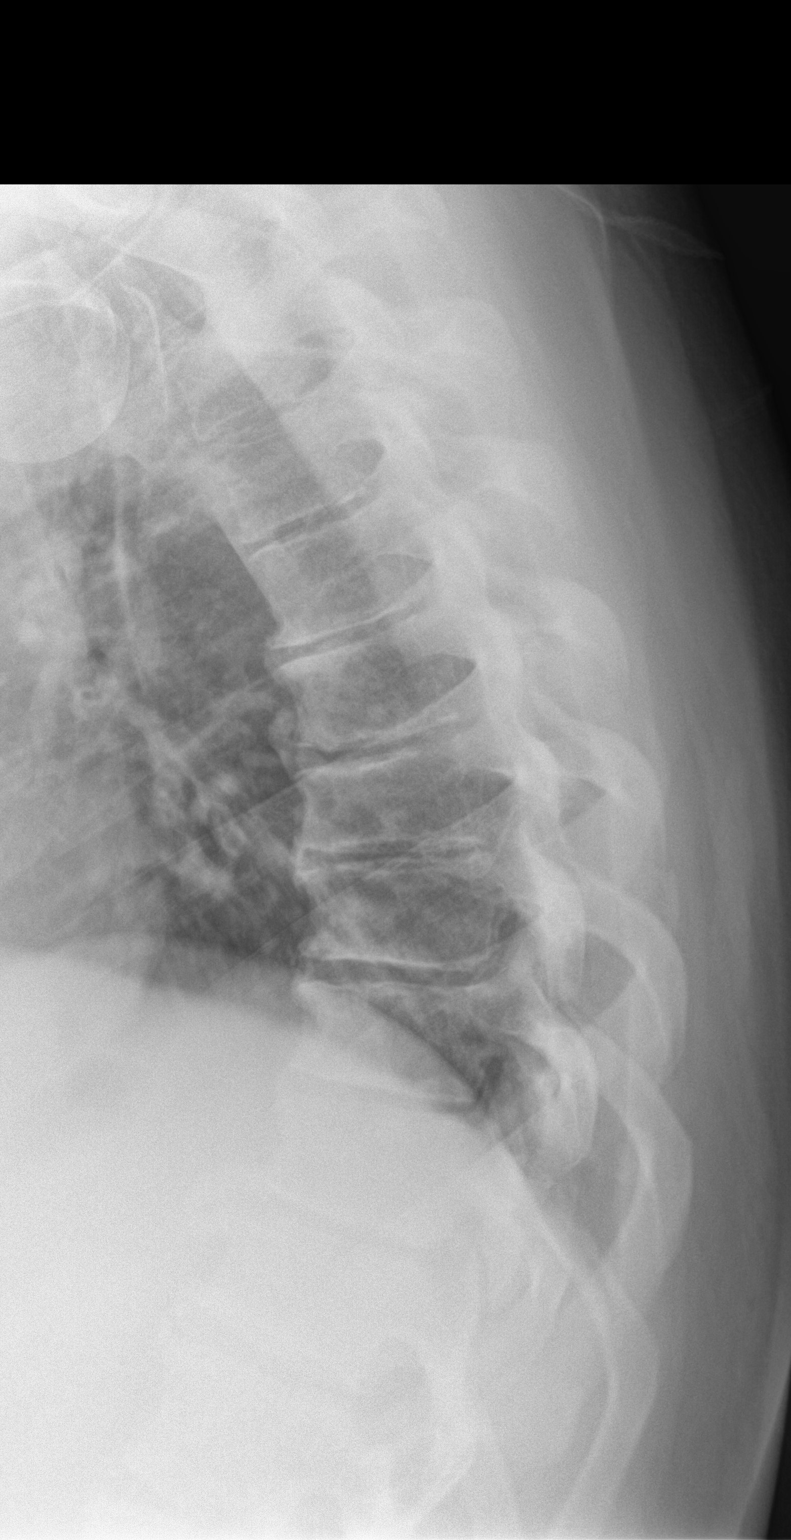

[t-spine swimmers]
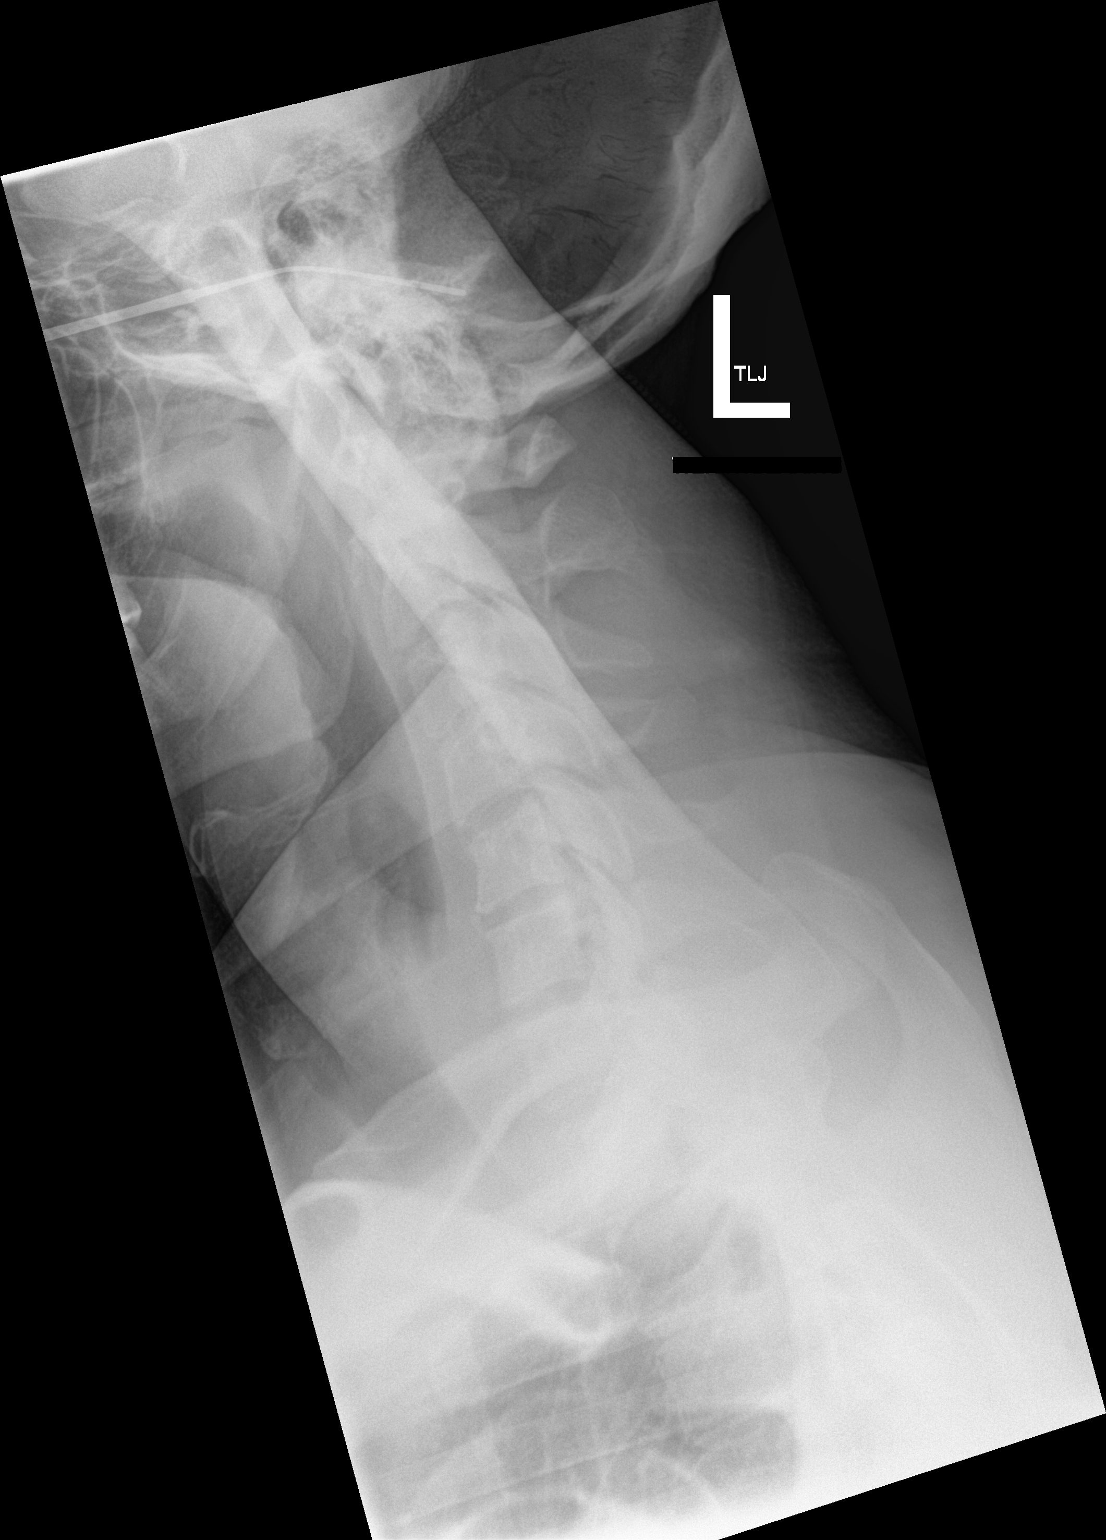

[3 of 3 positions shown; findings below may reference images not displayed]

FINDINGS: There is no evidence of thoracic spine fracture. Alignment is
normal. Moderate multilevel degenerative disc space height loss and
bridging osteophytosis throughout. No other significant bone
abnormalities are identified.
IMPRESSION: No fracture or dislocation of the thoracic spine. Moderate
multilevel degenerative disc space height loss and bridging
osteophytosis throughout.

## 2021-12-15 DIAGNOSIS — M5416 Radiculopathy, lumbar region: Secondary | ICD-10-CM | POA: Diagnosis not present

## 2022-01-11 ENCOUNTER — Other Ambulatory Visit: Payer: Self-pay | Admitting: Primary Care

## 2022-01-11 DIAGNOSIS — E039 Hypothyroidism, unspecified: Secondary | ICD-10-CM

## 2022-01-12 ENCOUNTER — Other Ambulatory Visit (INDEPENDENT_AMBULATORY_CARE_PROVIDER_SITE_OTHER): Payer: BC Managed Care – PPO

## 2022-01-12 DIAGNOSIS — E039 Hypothyroidism, unspecified: Secondary | ICD-10-CM | POA: Diagnosis not present

## 2022-01-12 LAB — TSH: TSH: 2.49 u[IU]/mL (ref 0.35–5.50)

## 2022-02-01 ENCOUNTER — Other Ambulatory Visit: Payer: Self-pay

## 2022-03-01 DIAGNOSIS — M5416 Radiculopathy, lumbar region: Secondary | ICD-10-CM | POA: Diagnosis not present

## 2022-04-13 DIAGNOSIS — Z79899 Other long term (current) drug therapy: Secondary | ICD-10-CM | POA: Diagnosis not present

## 2022-04-13 DIAGNOSIS — M1009 Idiopathic gout, multiple sites: Secondary | ICD-10-CM | POA: Diagnosis not present

## 2022-04-13 DIAGNOSIS — M1991 Primary osteoarthritis, unspecified site: Secondary | ICD-10-CM | POA: Diagnosis not present

## 2022-06-01 DIAGNOSIS — Z6841 Body Mass Index (BMI) 40.0 and over, adult: Secondary | ICD-10-CM | POA: Diagnosis not present

## 2022-06-01 DIAGNOSIS — M5136 Other intervertebral disc degeneration, lumbar region: Secondary | ICD-10-CM | POA: Diagnosis not present

## 2022-06-14 DIAGNOSIS — M5416 Radiculopathy, lumbar region: Secondary | ICD-10-CM | POA: Diagnosis not present

## 2022-07-13 ENCOUNTER — Encounter: Payer: Self-pay | Admitting: Primary Care

## 2022-07-13 ENCOUNTER — Ambulatory Visit: Payer: BC Managed Care – PPO | Admitting: Primary Care

## 2022-07-13 VITALS — BP 138/92 | HR 75 | Temp 98.1°F | Ht 67.0 in | Wt 289.0 lb

## 2022-07-13 DIAGNOSIS — R0683 Snoring: Secondary | ICD-10-CM | POA: Diagnosis not present

## 2022-07-13 NOTE — Progress Notes (Signed)
Subjective:    Patient ID: Peter Schubach, male    DOB: Jul 17, 1975, 47 y.o.   MRN: [id number]  HPI  Peter Fitzgerald is a very pleasant 47 y.o. male with a history of hypertension, hypothyroidism, prediabetes, chronic back pain, chronic gout who presents today to discuss snoring.  Chronic snoring for years. He's been told by his significant other that he snores, but he's also waking himself and his wife during the night with significant snoring. He's also noticed daytime tiredness, headaches upon waking, feels like he could doze off when sitting still.   He has never undergone a sleep study before. He suspects he has sleep apnea.   Review of Systems  Constitutional:  Positive for fatigue.  Respiratory:  Negative for shortness of breath.        Snoring  Cardiovascular:  Negative for chest pain.  Neurological:  Positive for headaches.         Past Medical History:  Diagnosis Date   Allergy    Arthritis    Chickenpox    Frequent headaches    Gout    Hypertension    Migraines    Thyroid disease     Social History   Socioeconomic History   Marital status: Married    Spouse name: Not on file   Number of children: Not on file   Years of education: Not on file   Highest education level: Not on file  Occupational History   Not on file  Tobacco Use   Smoking status: Never   Smokeless tobacco: Never  Vaping Use   Vaping Use: Never used  Substance and Sexual Activity   Alcohol use: Not Currently   Drug use: Never   Sexual activity: Not on file  Other Topics Concern   Not on file  Social History Narrative   Married.   One child.   Works as a Engineer, production.   Social Determinants of Health   Financial Resource Strain: Not on file  Food Insecurity: Not on file  Transportation Needs: Not on file  Physical Activity: Not on file  Stress: Not on file  Social Connections: Not on file  Intimate Partner Violence: Not on file    Past Surgical History:  Procedure Laterality Date    LAMINECTOMY AND MICRODISCECTOMY LUMBAR SPINE  12/2018   MINOR CARPAL TUNNEL  1997    Family History  Problem Relation Age of Onset   Alcohol abuse Mother    Arthritis Mother    COPD Mother    Drug abuse Mother    Early death Mother        untreated COPD, CKD   Kidney disease Mother        On dialysis    Learning disabilities Mother    Alcohol abuse Father    Drug abuse Father    Early death Father        Drowning    Allergies  Allergen Reactions   Colchicine Other (See Comments)    Current Outpatient Medications on File Prior to Visit  Medication Sig Dispense Refill   allopurinol (ZYLOPRIM) 300 MG tablet      cetirizine (ZYRTEC) 10 MG tablet Take 10 mg by mouth daily.     fluticasone (FLONASE) 50 MCG/ACT nasal spray Place 2 sprays into both nostrils daily. 16 g 0   levothyroxine (SYNTHROID) 112 MCG tablet TAKE 1 TABLET BY MOUTH EVERY MORNING ON AN EMPTY STOMACH WITH WATER ONLY. NO FOOD OR OTHER MEDICATIONS FOR 30 MINUTES. 90 tablet  2   losartan-hydrochlorothiazide (HYZAAR) 100-25 MG tablet TAKE 1 TABLET BY MOUTH DAILY FOR BLOOD PRESSURE 90 tablet 2   meloxicam (MOBIC) 15 MG tablet TAKE 1 TABLET BY MOUTH EVERY DAY 30 tablet 0   No current facility-administered medications on file prior to visit.    BP (!) 138/92   Pulse 75   Temp 98.1 F (36.7 C) (Temporal)   Ht  (1.702 m)   Wt 289 lb (131.1 kg)   SpO2 97%   BMI 45.26 kg/m  Objective:   Physical Exam Cardiovascular:     Rate and Rhythm: Normal rate and regular rhythm.  Pulmonary:     Effort: Pulmonary effort is normal.     Breath sounds: Normal breath sounds. No wheezing or rales.  Musculoskeletal:     Cervical back: Neck supple.  Skin:    General: Skin is warm and dry.  Neurological:     Mental Status: He is alert and oriented to person, place, and time.           Assessment & Plan:  Snoring Assessment & Plan: Symptoms suspicious for sleep apnea.  Epworth sleepiness score of 7  today.  Referral placed to pulmonology for sleep study.  Orders: -     Ambulatory referral to Pulmonology        Doreene Nest, NP

## 2022-07-13 NOTE — Patient Instructions (Signed)
You will either be contacted via phone regarding your referral to pulmonology, or you may receive a letter on your MyChart portal from our referral team with instructions for scheduling an appointment. Please let us know if you have not been contacted by anyone within two weeks.  It was a pleasure to see you today!

## 2022-07-13 NOTE — Assessment & Plan Note (Signed)
Symptoms suspicious for sleep apnea.  Epworth sleepiness score of 7 today.  Referral placed to pulmonology for sleep study.

## 2022-07-19 DIAGNOSIS — M5416 Radiculopathy, lumbar region: Secondary | ICD-10-CM | POA: Diagnosis not present

## 2022-07-29 ENCOUNTER — Ambulatory Visit: Payer: BC Managed Care – PPO | Admitting: Nurse Practitioner

## 2022-07-29 ENCOUNTER — Encounter: Payer: Self-pay | Admitting: Nurse Practitioner

## 2022-07-29 VITALS — BP 116/80 | HR 93 | Temp 98.5°F | Ht 66.0 in | Wt 292.8 lb

## 2022-07-29 DIAGNOSIS — R0683 Snoring: Secondary | ICD-10-CM

## 2022-07-29 DIAGNOSIS — G4719 Other hypersomnia: Secondary | ICD-10-CM

## 2022-07-29 NOTE — Assessment & Plan Note (Signed)
See above

## 2022-07-29 NOTE — Assessment & Plan Note (Signed)
He has snoring, excessive daytime sleepiness, nocturnal apneic events, morning headaches, restless sleep. BMI 47. History of HTN. Epworth 12. Given this,  I am concerned he could have sleep disordered breathing with obstructive sleep apnea. He will need sleep study for further evaluation.    - discussed how weight can impact sleep and risk for sleep disordered breathing - discussed options to assist with weight loss: combination of diet modification, cardiovascular and strength training exercises   - had an extensive discussion regarding the adverse health consequences related to untreated sleep disordered breathing - specifically discussed the risks for hypertension, coronary artery disease, cardiac dysrhythmias, cerebrovascular disease, and diabetes - lifestyle modification discussed   - discussed how sleep disruption can increase risk of accidents, particularly when driving - safe driving practices were discussed  Patient Instructions  Given your symptoms, I am concerned that you may have sleep disordered breathing with sleep apnea. You will need a sleep study for further evaluation. Someone will contact you to schedule this.   We discussed how untreated sleep apnea puts an individual at risk for cardiac arrhthymias, pulm HTN, DM, stroke and increases their risk for daytime accidents. We also briefly reviewed treatment options including weight loss, side sleeping position, oral appliance, CPAP therapy or referral to ENT for possible surgical options  Use caution when driving and pull over if you become sleepy.  Follow up in 6 weeks with Katie Everrett Lacasse,NP to go over sleep study results, or sooner, if needed

## 2022-07-29 NOTE — Patient Instructions (Signed)
Given your symptoms, I am concerned that you may have sleep disordered breathing with sleep apnea. You will need a sleep study for further evaluation. Someone will contact you to schedule this.   We discussed how untreated sleep apnea puts an individual at risk for cardiac arrhthymias, pulm HTN, DM, stroke and increases their risk for daytime accidents. We also briefly reviewed treatment options including weight loss, side sleeping position, oral appliance, CPAP therapy or referral to ENT for possible surgical options  Use caution when driving and pull over if you become sleepy.  Follow up in 6 weeks with Katie Lavere Stork,NP to go over sleep study results, or sooner, if needed   

## 2022-07-29 NOTE — Progress Notes (Signed)
@Patient  ID: Peter Fitzgerald, male    DOB: Oct 28, 1975, 47 y.o.   MRN: [id number]  Chief Complaint  Patient presents with   Follow-up    Snores, choking, headaches in the morning, day time sleepiness.     Referring provider: Doreene Nest, NP  HPI: 47 year old male, never smoker referred for sleep consult.  Past medical history significant for hypertension, hypothyroidism, prediabetes, gout.  TEST/EVENTS:   07/29/2022: Today-sleep consult Patient presents today for sleep consult, referred by Vernona Rieger, NP.  He has a long history of snoring.  He will wake himself up gasping for air at times.  Feels very tired during the day.  Typically wakes up feeling groggy.  He has morning headaches and dry mouth pretty much every morning.  Some nights he can take him up to an hour to fall asleep.  He usually is waking frequently throughout the night.  Feels like he is never well rested.  Denies any drowsy driving, sleep parasomnia/paralysis.  No history of narcolepsy or symptoms of cataplexy. Goes to bed between 9 to 10 PM.  Typically falls asleep within 10 to 15 minutes but sometimes takes longer.  Wakes several times at night.  Officially gets up around 4 to 6 AM.  No sleep aids.  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  Weight has been the same over the last 2 years.  Never had a previous sleep study. He has a history of hypertension, controlled on antihypertensives.  He has prediabetes.  No history of stroke.  He is a never smoker.  Rarely drinks alcohol.  He drinks around 2 pots of coffee a day.  Lives at home with his wife.  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 Morning headaches, dry mouth,  Wakes frequently   Epworth 12   Allergies  Allergen Reactions   Colchicine Other (See Comments)    Immunization History  Administered Date(s) Administered   Tdap 09/23/2020    Past Medical History:  Diagnosis Date   Allergy    Arthritis    Chickenpox    Frequent  headaches    Gout    Hypertension    Migraines    Thyroid disease     Tobacco History: Social History   Tobacco Use  Smoking Status Never  Smokeless Tobacco Never   Counseling given: Not Answered   Outpatient Medications Prior to Visit  Medication Sig Dispense Refill   allopurinol (ZYLOPRIM) 300 MG tablet      cetirizine (ZYRTEC) 10 MG tablet Take 10 mg by mouth daily.     fluticasone (FLONASE) 50 MCG/ACT nasal spray Place 2 sprays into both nostrils daily. 16 g 0   levothyroxine (SYNTHROID) 112 MCG tablet TAKE 1 TABLET BY MOUTH EVERY MORNING ON AN EMPTY STOMACH WITH WATER ONLY. NO FOOD OR OTHER MEDICATIONS FOR 30 MINUTES. 90 tablet 2   losartan-hydrochlorothiazide (HYZAAR) 100-25 MG tablet TAKE 1 TABLET BY MOUTH DAILY FOR BLOOD PRESSURE 90 tablet 2   meloxicam (MOBIC) 15 MG tablet TAKE 1 TABLET BY MOUTH EVERY DAY 30 tablet 0   No facility-administered medications prior to visit.     Review of Systems:   Constitutional: No weight loss or gain, night sweats, fevers, chills, or lassitude. +daytime fatigue  HEENT: No headaches, difficulty swallowing, tooth/dental problems, or sore throat. No itching, ear ache. +occasional nasal congestion, sneezing CV:  No chest pain, orthopnea, PND, swelling in lower extremities, anasarca, dizziness, palpitations, syncope Resp: +snoring, gasping at night,  occasional shortness of breath with exertion. No excess mucus or change in color of mucus. No productive or non-productive. No hemoptysis. No wheezing.  No chest wall deformity GI:  No heartburn, indigestion, abdominal pain, nausea, vomiting, diarrhea, change in bowel habits, loss of appetite, bloody stools.  GU: No dysuria, change in color of urine, urgency or frequency.  No flank pain, no hematuria  Skin: No rash, lesions, ulcerations MSK:  No joint pain or swelling.   Neuro: No dizziness or lightheadedness.  Psych: No depression or anxiety. Mood stable. +sleep disturbance    Physical  Exam:  BP 116/80   Pulse 93   Temp 98.5 F (36.9 C) (Oral)   Ht 5\' 6"  (1.676 m)   Wt 292 lb 12.8 oz (132.8 kg)   SpO2 95%   BMI 47.26 kg/m   GEN: Pleasant, interactive, well-appearing; obese; in no acute distress. HEENT:  Normocephalic and atraumatic. PERRLA. Sclera white. Nasal turbinates pink, moist and patent bilaterally. No rhinorrhea present. Oropharynx pink and moist, without exudate or edema. No lesions, ulcerations, or postnasal drip. Mallampati III/IV NECK:  Supple w/ fair ROM. No JVD present. Normal carotid impulses w/o bruits. Thyroid symmetrical with no goiter or nodules palpated. No lymphadenopathy.   CV: RRR, no m/r/g, no peripheral edema. Pulses intact, +2 bilaterally. No cyanosis, pallor or clubbing. PULMONARY:  Unlabored, regular breathing. Clear bilaterally A&P w/o wheezes/rales/rhonchi. No accessory muscle use.  GI: BS present and normoactive. Soft, non-tender to palpation. No organomegaly or masses detected.  MSK: No erythema, warmth or tenderness. Cap refil <2 sec all extrem. No deformities or joint swelling noted.  Neuro: A/Ox3. No focal deficits noted.   Skin: Warm, no lesions or rashe Psych: Normal affect and behavior. Judgement and thought content appropriate.     Lab Results:  CBC    Component Value Date/Time   WBC 6.4 09/24/2021 0901   RBC 4.30 09/24/2021 0901   HGB 13.1 09/24/2021 0901   HCT 39.8 09/24/2021 0901   PLT 305.0 09/24/2021 0901   MCV 92.7 09/24/2021 0901   MCHC 33.0 09/24/2021 0901   RDW 13.2 09/24/2021 0901    BMET    Component Value Date/Time   NA 139 09/24/2021 0901   K 4.3 09/24/2021 0901   CL 99 09/24/2021 0901   CO2 32 09/24/2021 0901   GLUCOSE 126 (H) 09/24/2021 0901   BUN 14 09/24/2021 0901   CREATININE 0.95 09/24/2021 0901   CALCIUM 9.7 09/24/2021 0901    BNP No results found for: "BNP"   Imaging:  No results found.        No data to display          No results found for:  "NITRICOXIDE"      Assessment & Plan:   Excessive daytime sleepiness He has snoring, excessive daytime sleepiness, nocturnal apneic events, morning headaches, restless sleep. BMI 47. History of HTN. Epworth 12. Given this,  I am concerned he could have sleep disordered breathing with obstructive sleep apnea. He will need sleep study for further evaluation.    - discussed how weight can impact sleep and risk for sleep disordered breathing - discussed options to assist with weight loss: combination of diet modification, cardiovascular and strength training exercises   - had an extensive discussion regarding the adverse health consequences related to untreated sleep disordered breathing - specifically discussed the risks for hypertension, coronary artery disease, cardiac dysrhythmias, cerebrovascular disease, and diabetes - lifestyle modification discussed   - discussed how sleep disruption can  increase risk of accidents, particularly when driving - safe driving practices were discussed  Patient Instructions  Given your symptoms, I am concerned that you may have sleep disordered breathing with sleep apnea. You will need a sleep study for further evaluation. Someone will contact you to schedule this.   We discussed how untreated sleep apnea puts an individual at risk for cardiac arrhthymias, pulm HTN, DM, stroke and increases their risk for daytime accidents. We also briefly reviewed treatment options including weight loss, side sleeping position, oral appliance, CPAP therapy or referral to ENT for possible surgical options  Use caution when driving and pull over if you become sleepy.  Follow up in 6 weeks with Florentina Addison Cassia Fein,NP to go over sleep study results, or sooner, if needed     Snoring See above  I spent 35 minutes of dedicated to the care of this patient on the date of this encounter to include pre-visit review of records, face-to-face time with the patient discussing conditions  above, post visit ordering of testing, clinical documentation with the electronic health record, making appropriate referrals as documented, and communicating necessary findings to members of the patients care team.  Noemi Chapel, NP 07/29/2022  Pt aware and understands NP's role.

## 2022-08-04 DIAGNOSIS — L730 Acne keloid: Secondary | ICD-10-CM | POA: Diagnosis not present

## 2022-08-04 DIAGNOSIS — C4441 Basal cell carcinoma of skin of scalp and neck: Secondary | ICD-10-CM | POA: Diagnosis not present

## 2022-08-04 DIAGNOSIS — D224 Melanocytic nevi of scalp and neck: Secondary | ICD-10-CM | POA: Diagnosis not present

## 2022-08-04 DIAGNOSIS — L814 Other melanin hyperpigmentation: Secondary | ICD-10-CM | POA: Diagnosis not present

## 2022-08-15 DIAGNOSIS — G4730 Sleep apnea, unspecified: Secondary | ICD-10-CM | POA: Diagnosis not present

## 2022-08-31 DIAGNOSIS — C4441 Basal cell carcinoma of skin of scalp and neck: Secondary | ICD-10-CM | POA: Diagnosis not present

## 2022-09-01 ENCOUNTER — Ambulatory Visit (INDEPENDENT_AMBULATORY_CARE_PROVIDER_SITE_OTHER): Payer: BC Managed Care – PPO

## 2022-09-01 DIAGNOSIS — G4719 Other hypersomnia: Secondary | ICD-10-CM

## 2022-09-01 DIAGNOSIS — G4733 Obstructive sleep apnea (adult) (pediatric): Secondary | ICD-10-CM

## 2022-09-13 ENCOUNTER — Ambulatory Visit: Payer: BC Managed Care – PPO | Admitting: Nurse Practitioner

## 2022-09-13 ENCOUNTER — Encounter: Payer: Self-pay | Admitting: Nurse Practitioner

## 2022-09-13 VITALS — BP 130/80 | HR 66 | Temp 98.0°F | Ht 66.0 in | Wt 288.2 lb

## 2022-09-13 DIAGNOSIS — R0683 Snoring: Secondary | ICD-10-CM | POA: Diagnosis not present

## 2022-09-13 DIAGNOSIS — G4733 Obstructive sleep apnea (adult) (pediatric): Secondary | ICD-10-CM | POA: Diagnosis not present

## 2022-09-13 DIAGNOSIS — Z6841 Body Mass Index (BMI) 40.0 and over, adult: Secondary | ICD-10-CM

## 2022-09-13 NOTE — Progress Notes (Signed)
@Patient  ID: Peter Fitzgerald, male    DOB: 05/27/1975, 47 y.o.   MRN: [id number]  Chief Complaint  Patient presents with   Follow-up    Hst     Referring provider: Doreene Nest, NP  HPI: 47 year old male, never smoker referred for sleep consult May 2024.  Past medical history significant for hypertension, hypothyroidism, prediabetes, gout.  TEST/EVENTS:  08/15/2022 HST: AHI 27.5/h, SpO2 low 63%  07/29/2022: Ov with Seraphina Mitchner NP for sleep consult, referred by Vernona Rieger, NP.  He has a long history of snoring.  He will wake himself up gasping for air at times.  Feels very tired during the day.  Typically wakes up feeling groggy.  He has morning headaches and dry mouth pretty much every morning.  Some nights he can take him up to an hour to fall asleep.  He usually is waking frequently throughout the night.  Feels like he is never well rested.  Denies any drowsy driving, sleep parasomnia/paralysis.  No history of narcolepsy or symptoms of cataplexy. Goes to bed between 9 to 10 PM.  Typically falls asleep within 10 to 15 minutes but sometimes takes longer.  Wakes several times at night.  Officially gets up around 4 to 6 AM.  No sleep aids.  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  Weight has been the same over the last 2 years.  Never had a previous sleep study. He has a history of hypertension, controlled on antihypertensives.  He has prediabetes.  No history of stroke.  He is a never smoker.  Rarely drinks alcohol.  He drinks around 2 pots of coffee a day.  Lives at home with his wife.  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 Epworth 12  09/13/2022: Today - follow up Patient presents today for follow up to discuss sleep study results which revealed moderate obstructive sleep apnea. He feels unchanged compared to his last visit. Has a lot of trouble with daytime tiredness and poor quality sleep at night. He would like to discuss treatment options. He denies any morning  headaches or drowsy driving.   Allergies  Allergen Reactions   Colchicine Other (See Comments)    Immunization History  Administered Date(s) Administered   Tdap 09/23/2020    Past Medical History:  Diagnosis Date   Allergy    Arthritis    Chickenpox    Frequent headaches    Gout    Hypertension    Migraines    Thyroid disease     Tobacco History: Social History   Tobacco Use  Smoking Status Never  Smokeless Tobacco Never   Counseling given: Not Answered   Outpatient Medications Prior to Visit  Medication Sig Dispense Refill   allopurinol (ZYLOPRIM) 300 MG tablet      cetirizine (ZYRTEC) 10 MG tablet Take 10 mg by mouth daily.     fluticasone (FLONASE) 50 MCG/ACT nasal spray Place 2 sprays into both nostrils daily. 16 g 0   levothyroxine (SYNTHROID) 112 MCG tablet TAKE 1 TABLET BY MOUTH EVERY MORNING ON AN EMPTY STOMACH WITH WATER ONLY. NO FOOD OR OTHER MEDICATIONS FOR 30 MINUTES. 90 tablet 2   losartan-hydrochlorothiazide (HYZAAR) 100-25 MG tablet TAKE 1 TABLET BY MOUTH DAILY FOR BLOOD PRESSURE 90 tablet 2   meloxicam (MOBIC) 15 MG tablet TAKE 1 TABLET BY MOUTH EVERY DAY 30 tablet 0   No facility-administered medications prior to visit.     Review of Systems:   Constitutional: No  weight loss or gain, night sweats, fevers, chills, or lassitude. +daytime fatigue  HEENT: No headaches, difficulty swallowing, tooth/dental problems, or sore throat. No itching, ear ache. +occasional nasal congestion, sneezing CV:  No chest pain, orthopnea, PND, swelling in lower extremities, anasarca, dizziness, palpitations, syncope Resp: +snoring, gasping at night, occasional shortness of breath with exertion. No excess mucus or change in color of mucus. No productive or non-productive. No hemoptysis. No wheezing.  No chest wall deformity GI:  No heartburn, indigestion GU: No dysuria, change in color of urine, urgency or frequency Skin: No rash, lesions, ulcerations MSK:  No joint  pain or swelling.   Neuro: No dizziness or lightheadedness.  Psych: No depression or anxiety. Mood stable. +sleep disturbance    Physical Exam:  BP 130/80   Pulse 66   Temp 98 F (36.7 C) (Oral)   Ht 5\' 6"  (1.676 m)   Wt 288 lb 3.2 oz (130.7 kg)   SpO2 98%   BMI 46.52 kg/m   GEN: Pleasant, interactive, well-appearing; obese; in no acute distress. HEENT:  Normocephalic and atraumatic. PERRLA. Sclera white. Nasal turbinates pink, moist and patent bilaterally. No rhinorrhea present. Oropharynx pink and moist, without exudate or edema. No lesions, ulcerations, or postnasal drip. Mallampati III/IV NECK:  Supple w/ fair ROM. No JVD present. Normal carotid impulses w/o bruits. Thyroid symmetrical with no goiter or nodules palpated. No lymphadenopathy.   CV: RRR, no m/r/g, no peripheral edema. Pulses intact, +2 bilaterally. No cyanosis, pallor or clubbing. PULMONARY:  Unlabored, regular breathing. Clear bilaterally A&P w/o wheezes/rales/rhonchi. No accessory muscle use.  GI: BS present and normoactive. Soft, non-tender to palpation. No organomegaly or masses detected.  MSK: No erythema, warmth or tenderness. Cap refil <2 sec all extrem. No deformities or joint swelling noted.  Neuro: A/Ox3. No focal deficits noted.   Skin: Warm, no lesions or rashe Psych: Normal affect and behavior. Judgement and thought content appropriate.     Lab Results:  CBC    Component Value Date/Time   WBC 6.4 09/24/2021 0901   RBC 4.30 09/24/2021 0901   HGB 13.1 09/24/2021 0901   HCT 39.8 09/24/2021 0901   PLT 305.0 09/24/2021 0901   MCV 92.7 09/24/2021 0901   MCHC 33.0 09/24/2021 0901   RDW 13.2 09/24/2021 0901    BMET    Component Value Date/Time   NA 139 09/24/2021 0901   K 4.3 09/24/2021 0901   CL 99 09/24/2021 0901   CO2 32 09/24/2021 0901   GLUCOSE 126 (H) 09/24/2021 0901   BUN 14 09/24/2021 0901   CREATININE 0.95 09/24/2021 0901   CALCIUM 9.7 09/24/2021 0901    BNP No results found  for: "BNP"   Imaging:  No results found.        No data to display          No results found for: "NITRICOXIDE"      Assessment & Plan:   Moderate obstructive sleep apnea Moderate OSA with AHI 27.5/h and significant daytime symptoms. Reviewed risks of untreated OSA and potential treatment options. Shared decision to move forward with CPAP therapy. Orders placed for auto CPAP 5-20 cmH2O, mask of choice and heated humidity. Educated on proper care/use of device. Risks/benefits reviewed. Cautioned on safe driving practices.  Patient Instructions  Start CPAP 5-20 cmH2O, mask of choice, and heated humidity, every night, minimum of 4-6 hours a night.  Change equipment every 30 days or as directed by DME. Wash your tubing with warm soap and water  daily, hang to dry. Wash humidifier portion weekly. Use bottled, distilled water and change daily  Be aware of reduced alertness and do not drive or operate heavy machinery if experiencing this or drowsiness.  Exercise encouraged, as tolerated. Notify if persistent daytime sleepiness occurs even with consistent use of CPAP.  We discussed how untreated sleep apnea puts an individual at risk for cardiac arrhthymias, pulm HTN, DM, stroke and increases their risk for daytime accidents. We also briefly reviewed treatment options including weight loss, side sleeping position, oral appliance, CPAP therapy or referral to ENT for possible surgical options  Follow up in 12 weeks with Dr. 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 Healthy weight loss encouraged.   I spent 35 minutes of dedicated to the care of this patient on the date of this encounter to include pre-visit review of records, face-to-face time with the patient discussing conditions above, post visit ordering of testing, clinical documentation with the electronic  health record, making appropriate referrals as documented, and communicating necessary findings to members of the patients care team.  Noemi Chapel, NP 09/13/2022  Pt aware and understands NP's role.

## 2022-09-13 NOTE — Patient Instructions (Signed)
Start CPAP 5-20 cmH2O, mask of choice, and heated humidity, every night, minimum of 4-6 hours a night.  Change equipment every 30 days or as directed by DME. Wash your tubing with warm soap and water daily, hang to dry. Wash humidifier portion weekly. Use bottled, distilled water and change daily  Be aware of reduced alertness and do not drive or operate heavy machinery if experiencing this or drowsiness.  Exercise encouraged, as tolerated. Notify if persistent daytime sleepiness occurs even with consistent use of CPAP.  We discussed how untreated sleep apnea puts an individual at risk for cardiac arrhthymias, pulm HTN, DM, stroke and increases their risk for daytime accidents. We also briefly reviewed treatment options including weight loss, side sleeping position, oral appliance, CPAP therapy or referral to ENT for possible surgical options  Follow up in 12 weeks with Dr. Wynona Neat or Philis Nettle, or sooner, if needed

## 2022-09-13 NOTE — Assessment & Plan Note (Signed)
BMI 46. Healthy weight loss encouraged 

## 2022-09-13 NOTE — Assessment & Plan Note (Signed)
Moderate OSA with AHI 27.5/h and significant daytime symptoms. Reviewed risks of untreated OSA and potential treatment options. Shared decision to move forward with CPAP therapy. Orders placed for auto CPAP 5-20 cmH2O, mask of choice and heated humidity. Educated on proper care/use of device. Risks/benefits reviewed. Cautioned on safe driving practices.  Patient Instructions  Start CPAP 5-20 cmH2O, mask of choice, and heated humidity, every night, minimum of 4-6 hours a night.  Change equipment every 30 days or as directed by DME. Wash your tubing with warm soap and water daily, hang to dry. Wash humidifier portion weekly. Use bottled, distilled water and change daily  Be aware of reduced alertness and do not drive or operate heavy machinery if experiencing this or drowsiness.  Exercise encouraged, as tolerated. Notify if persistent daytime sleepiness occurs even with consistent use of CPAP.  We discussed how untreated sleep apnea puts an individual at risk for cardiac arrhthymias, pulm HTN, DM, stroke and increases their risk for daytime accidents. We also briefly reviewed treatment options including weight loss, side sleeping position, oral appliance, CPAP therapy or referral to ENT for possible surgical options  Follow up in 12 weeks with Dr. Wynona Neat or Philis Nettle, or sooner, if needed

## 2022-09-21 DIAGNOSIS — G4733 Obstructive sleep apnea (adult) (pediatric): Secondary | ICD-10-CM | POA: Diagnosis not present

## 2022-09-27 DIAGNOSIS — M5416 Radiculopathy, lumbar region: Secondary | ICD-10-CM | POA: Diagnosis not present

## 2022-09-28 ENCOUNTER — Ambulatory Visit (INDEPENDENT_AMBULATORY_CARE_PROVIDER_SITE_OTHER): Payer: BC Managed Care – PPO | Admitting: Primary Care

## 2022-09-28 ENCOUNTER — Encounter: Payer: Self-pay | Admitting: Primary Care

## 2022-09-28 ENCOUNTER — Encounter: Payer: No Typology Code available for payment source | Admitting: Primary Care

## 2022-09-28 VITALS — BP 134/82 | HR 67 | Temp 97.5°F | Ht 66.0 in | Wt 289.0 lb

## 2022-09-28 DIAGNOSIS — Z1159 Encounter for screening for other viral diseases: Secondary | ICD-10-CM | POA: Diagnosis not present

## 2022-09-28 DIAGNOSIS — I10 Essential (primary) hypertension: Secondary | ICD-10-CM

## 2022-09-28 DIAGNOSIS — R7303 Prediabetes: Secondary | ICD-10-CM

## 2022-09-28 DIAGNOSIS — G8929 Other chronic pain: Secondary | ICD-10-CM

## 2022-09-28 DIAGNOSIS — M1A9XX0 Chronic gout, unspecified, without tophus (tophi): Secondary | ICD-10-CM

## 2022-09-28 DIAGNOSIS — M5136 Other intervertebral disc degeneration, lumbar region: Secondary | ICD-10-CM

## 2022-09-28 DIAGNOSIS — Z114 Encounter for screening for human immunodeficiency virus [HIV]: Secondary | ICD-10-CM | POA: Diagnosis not present

## 2022-09-28 DIAGNOSIS — M51369 Other intervertebral disc degeneration, lumbar region without mention of lumbar back pain or lower extremity pain: Secondary | ICD-10-CM

## 2022-09-28 DIAGNOSIS — G4719 Other hypersomnia: Secondary | ICD-10-CM

## 2022-09-28 DIAGNOSIS — M546 Pain in thoracic spine: Secondary | ICD-10-CM

## 2022-09-28 DIAGNOSIS — G4733 Obstructive sleep apnea (adult) (pediatric): Secondary | ICD-10-CM | POA: Diagnosis not present

## 2022-09-28 DIAGNOSIS — E039 Hypothyroidism, unspecified: Secondary | ICD-10-CM

## 2022-09-28 DIAGNOSIS — Z0000 Encounter for general adult medical examination without abnormal findings: Secondary | ICD-10-CM

## 2022-09-28 LAB — TSH: TSH: 0.88 u[IU]/mL (ref 0.35–5.50)

## 2022-09-28 LAB — LIPID PANEL
Cholesterol: 211 mg/dL — ABNORMAL HIGH (ref 0–200)
HDL: 50.1 mg/dL (ref >39.00)
LDL Cholesterol: 135 mg/dL — ABNORMAL HIGH (ref 0–99)
NonHDL: 160.49
Total CHOL/HDL Ratio: 4
Triglycerides: 129 mg/dL (ref 0.0–149.0)
VLDL: 25.8 mg/dL (ref 0.0–40.0)

## 2022-09-28 LAB — COMPREHENSIVE METABOLIC PANEL
ALT: 18 U/L (ref 0–53)
AST: 11 U/L (ref 0–37)
Albumin: 4.8 g/dL (ref 3.5–5.2)
Alkaline Phosphatase: 69 U/L (ref 39–117)
BUN: 16 mg/dL (ref 6–23)
CO2: 25 mEq/L (ref 19–32)
Calcium: 10 mg/dL (ref 8.4–10.5)
Chloride: 101 mEq/L (ref 96–112)
Creatinine, Ser: 0.78 mg/dL (ref 0.40–1.50)
GFR: 107.35 mL/min (ref >60.00)
Glucose, Bld: 147 mg/dL — ABNORMAL HIGH (ref 70–99)
Potassium: 4.1 mEq/L (ref 3.5–5.1)
Sodium: 138 mEq/L (ref 135–145)
Total Bilirubin: 0.4 mg/dL (ref 0.2–1.2)
Total Protein: 7.2 g/dL (ref 6.0–8.3)

## 2022-09-28 LAB — URIC ACID: Uric Acid, Serum: 5.8 mg/dL (ref 4.0–7.8)

## 2022-09-28 LAB — HEMOGLOBIN A1C: Hgb A1c MFr Bld: 6.3 % (ref 4.6–6.5)

## 2022-09-28 NOTE — Assessment & Plan Note (Signed)
Repeat A1C pending.  Discussed the importance of a healthy diet and regular exercise in order for weight loss, and to reduce the risk of further co-morbidity.  

## 2022-09-28 NOTE — Assessment & Plan Note (Signed)
Following with pain management at [facility].   Continue Meloxicam 15 mg daily.  CMP pending.

## 2022-09-28 NOTE — Assessment & Plan Note (Signed)
Immunizations UTD. Colonoscopy due, he declines this year despite recommendations  Discussed the importance of a healthy diet and regular exercise in order for weight loss, and to reduce the risk of further co-morbidity.  Exam stable. Labs pending.  Follow up in 1 year for repeat physical.

## 2022-09-28 NOTE — Assessment & Plan Note (Signed)
Improved!  Continue CPAP machine nightly.

## 2022-09-28 NOTE — Patient Instructions (Signed)
Stop by the lab prior to leaving today. I will notify you of your results once received.   It was a pleasure to see you today!  

## 2022-09-28 NOTE — Progress Notes (Signed)
Subjective:    Patient ID: Peter Fitzgerald, male    DOB: 1975-03-30, 47 y.o.   MRN: [id number]  HPI  Peter Fitzgerald is a very pleasant 47 y.o. male who presents today for complete physical and follow up of chronic conditions.  Immunizations: -Tetanus: Completed in 2022  Diet: Fair diet.  Exercise: No regular exercise. Active at work.   Eye exam: Completes annually  Dental exam: Completes semi-annually    Colonoscopy: Never completed, declines   BP Readings from Last 3 Encounters:  09/28/22 134/82  09/13/22 130/80  07/29/22 116/80     Review of Systems  Constitutional:  Negative for unexpected weight change.  HENT:  Negative for rhinorrhea.   Respiratory:  Negative for cough and shortness of breath.   Cardiovascular:  Negative for chest pain.  Gastrointestinal:  Negative for constipation and diarrhea.  Genitourinary:  Negative for difficulty urinating.  Musculoskeletal:  Negative for arthralgias and myalgias.  Skin:  Negative for rash.  Allergic/Immunologic: Negative for environmental allergies.  Neurological:  Negative for dizziness, numbness and headaches.  Psychiatric/Behavioral:  The patient is not nervous/anxious.          Past Medical History:  Diagnosis Date   Allergy    Arthritis    Chickenpox    Frequent headaches    Gout    Hypertension    Migraines    Thyroid disease     Social History   Socioeconomic History   Marital status: Married    Spouse name: Not on file   Number of children: Not on file   Years of education: Not on file   Highest education level: Not on file  Occupational History   Not on file  Tobacco Use   Smoking status: Never   Smokeless tobacco: Never  Vaping Use   Vaping Use: Never used  Substance and Sexual Activity   Alcohol use: Not Currently   Drug use: Never   Sexual activity: Not on file  Other Topics Concern   Not on file  Social History Narrative   Married.   One child.   Works as a Engineer, production.   Social Determinants  of Health   Financial Resource Strain: Not on file  Food Insecurity: Not on file  Transportation Needs: Not on file  Physical Activity: Not on file  Stress: Not on file  Social Connections: Not on file  Intimate Partner Violence: Not on file    Past Surgical History:  Procedure Laterality Date   LAMINECTOMY AND MICRODISCECTOMY LUMBAR SPINE  12/2018   MINOR CARPAL TUNNEL  1997    Family History  Problem Relation Age of Onset   Alcohol abuse Mother    Arthritis Mother    COPD Mother    Drug abuse Mother    Early death Mother        untreated COPD, CKD   Kidney disease Mother        On dialysis    Learning disabilities Mother    Alcohol abuse Father    Drug abuse Father    Early death Father        Drowning    Allergies  Allergen Reactions   Colchicine Other (See Comments)    Current Outpatient Medications on File Prior to Visit  Medication Sig Dispense Refill   allopurinol (ZYLOPRIM) 300 MG tablet      cetirizine (ZYRTEC) 10 MG tablet Take 10 mg by mouth daily.     fluticasone (FLONASE) 50 MCG/ACT nasal spray Place 2 sprays  into both nostrils daily. 16 g 0   levothyroxine (SYNTHROID) 112 MCG tablet TAKE 1 TABLET BY MOUTH EVERY MORNING ON AN EMPTY STOMACH WITH WATER ONLY. NO FOOD OR OTHER MEDICATIONS FOR 30 MINUTES. 90 tablet 2   losartan-hydrochlorothiazide (HYZAAR) 100-25 MG tablet TAKE 1 TABLET BY MOUTH DAILY FOR BLOOD PRESSURE 90 tablet 2   meloxicam (MOBIC) 15 MG tablet TAKE 1 TABLET BY MOUTH EVERY DAY 30 tablet 0   No current facility-administered medications on file prior to visit.    BP 134/82   Pulse 67   Temp (!) 97.5 F (36.4 C) (Temporal)   Ht 5\' 6"  (1.676 m)   Wt 289 lb (131.1 kg)   SpO2 98%   BMI 46.65 kg/m  Objective:   Physical Exam HENT:     Right Ear: Tympanic membrane and ear canal normal.     Left Ear: Tympanic membrane and ear canal normal.     Nose: Nose normal.     Right Sinus: No maxillary sinus tenderness or frontal sinus  tenderness.     Left Sinus: No maxillary sinus tenderness or frontal sinus tenderness.  Eyes:     Conjunctiva/sclera: Conjunctivae normal.  Neck:     Thyroid: No thyromegaly.     Vascular: No carotid bruit.  Cardiovascular:     Rate and Rhythm: Normal rate and regular rhythm.     Heart sounds: Normal heart sounds.  Pulmonary:     Effort: Pulmonary effort is normal.     Breath sounds: Normal breath sounds. No wheezing or rales.  Abdominal:     General: Bowel sounds are normal.     Palpations: Abdomen is soft.     Tenderness: There is no abdominal tenderness.  Musculoskeletal:        General: Normal range of motion.     Cervical back: Neck supple.  Skin:    General: Skin is warm and dry.  Neurological:     Mental Status: He is alert and oriented to person, place, and time.     Cranial Nerves: No cranial nerve deficit.     Deep Tendon Reflexes: Reflexes are normal and symmetric.  Psychiatric:        Mood and Affect: Mood normal.           Assessment & Plan:  Moderate obstructive sleep apnea Assessment & Plan: Following with pulmonology, reviewed office notes from June 2024. Continue CPAP nightly    Essential hypertension Assessment & Plan: Overall controlled.  Now sleep apnea is being treated, BP may reduce.  Continue losartan-hydrochlorothiazide 100-25 mg daily.  CMP pending.   Orders: -     Lipid panel -     Comprehensive metabolic panel  Hypothyroidism, unspecified type Assessment & Plan: He is taking levothyroxine correctly. Continue levothyroxine 112 mcg daily.  Repeat TSH pending.  Orders: -     TSH  DDD (degenerative disc disease), lumbar Assessment & Plan: Following with pain management at [facility].   Continue Meloxicam 15 mg daily.  CMP pending.   Chronic gout without tophus, unspecified cause, unspecified site Assessment & Plan: No recent flares.  Continue allopurinol 300 mg daily. Uric acid level pending.  Orders: -      Uric acid  Chronic midline thoracic back pain Assessment & Plan: Stable. Following with [facility]. Continue meloxicam 15 mg daily.  Renal function pending.   Excessive daytime sleepiness Assessment & Plan: Improved!  Continue CPAP machine nightly.   Prediabetes Assessment & Plan: Repeat A1C pending.  Discussed the importance of a healthy diet and regular exercise in order for weight loss, and to reduce the risk of further co-morbidity.   Orders: -     Hemoglobin A1c  Encounter for hepatitis C screening test for low risk patient -     Hepatitis C antibody  Screening for HIV (human immunodeficiency virus) -     HIV Antibody (routine testing w rflx)  Preventative health care Assessment & Plan: Immunizations UTD. Colonoscopy due, he declines this year despite recommendations  Discussed the importance of a healthy diet and regular exercise in order for weight loss, and to reduce the risk of further co-morbidity.  Exam stable. Labs pending.  Follow up in 1 year for repeat physical.          Doreene Nest, NP

## 2022-09-28 NOTE — Assessment & Plan Note (Signed)
Overall controlled.  Now sleep apnea is being treated, BP may reduce.  Continue losartan-hydrochlorothiazide 100-25 mg daily.  CMP pending.

## 2022-09-28 NOTE — Assessment & Plan Note (Signed)
Following with pulmonology, reviewed office notes from June 2024. Continue CPAP nightly

## 2022-09-28 NOTE — Assessment & Plan Note (Signed)
He is taking levothyroxine correctly. Continue levothyroxine 112 mcg daily.  Repeat TSH pending.

## 2022-09-28 NOTE — Assessment & Plan Note (Signed)
No recent flares.  Continue allopurinol 300 mg daily. Uric acid level pending.

## 2022-09-28 NOTE — Assessment & Plan Note (Signed)
Stable. Following with [facility]. Continue meloxicam 15 mg daily.  Renal function pending.

## 2022-09-29 LAB — HEPATITIS C ANTIBODY: Hepatitis C Ab: NONREACTIVE

## 2022-09-29 LAB — HIV ANTIBODY (ROUTINE TESTING W REFLEX): HIV 1&2 Ab, 4th Generation: NONREACTIVE

## 2022-10-05 DIAGNOSIS — G4733 Obstructive sleep apnea (adult) (pediatric): Secondary | ICD-10-CM | POA: Diagnosis not present

## 2022-10-12 DIAGNOSIS — Z79899 Other long term (current) drug therapy: Secondary | ICD-10-CM | POA: Diagnosis not present

## 2022-10-12 DIAGNOSIS — M1991 Primary osteoarthritis, unspecified site: Secondary | ICD-10-CM | POA: Diagnosis not present

## 2022-10-12 DIAGNOSIS — G629 Polyneuropathy, unspecified: Secondary | ICD-10-CM | POA: Diagnosis not present

## 2022-10-12 DIAGNOSIS — M1009 Idiopathic gout, multiple sites: Secondary | ICD-10-CM | POA: Diagnosis not present

## 2022-10-21 DIAGNOSIS — G4733 Obstructive sleep apnea (adult) (pediatric): Secondary | ICD-10-CM | POA: Diagnosis not present

## 2022-11-07 ENCOUNTER — Other Ambulatory Visit: Payer: Self-pay | Admitting: Primary Care

## 2022-11-07 DIAGNOSIS — E039 Hypothyroidism, unspecified: Secondary | ICD-10-CM

## 2022-11-21 DIAGNOSIS — G4733 Obstructive sleep apnea (adult) (pediatric): Secondary | ICD-10-CM | POA: Diagnosis not present

## 2022-12-06 ENCOUNTER — Ambulatory Visit: Payer: BC Managed Care – PPO | Admitting: Nurse Practitioner

## 2022-12-06 ENCOUNTER — Encounter: Payer: Self-pay | Admitting: Nurse Practitioner

## 2022-12-06 VITALS — BP 128/78 | HR 75 | Ht 66.5 in | Wt 291.0 lb

## 2022-12-06 DIAGNOSIS — Z6841 Body Mass Index (BMI) 40.0 and over, adult: Secondary | ICD-10-CM | POA: Diagnosis not present

## 2022-12-06 DIAGNOSIS — G4733 Obstructive sleep apnea (adult) (pediatric): Secondary | ICD-10-CM

## 2022-12-06 NOTE — Progress Notes (Signed)
@Patient  ID: Peter Fitzgerald, male    DOB: [birth year], 47 y.o.   MRN: [id number]  Chief Complaint  Patient presents with   Follow-up    F/u on cpap     Referring provider: Doreene Nest, NP  HPI: 47 year old male, never smoker followed for sleep apnea.  Past medical history significant for hypertension, hypothyroidism, prediabetes, gout.  TEST/EVENTS:  08/15/2022 HST: AHI 27.5/h, SpO2 low 63%  07/29/2022: Ov with Mohamud Mrozek NP for sleep consult, referred by Vernona Rieger, NP.  He has a long history of snoring.  He will wake himself up gasping for air at times.  Feels very tired during the day.  Typically wakes up feeling groggy.  He has morning headaches and dry mouth pretty much every morning.  Some nights he can take him up to an hour to fall asleep.  He usually is waking frequently throughout the night.  Feels like he is never well rested.  Denies any drowsy driving, sleep parasomnia/paralysis.  No history of narcolepsy or symptoms of cataplexy. Goes to bed between 9 to 10 PM.  Typically falls asleep within 10 to 15 minutes but sometimes takes longer.  Wakes several times at night.  Officially gets up around 4 to 6 AM.  No sleep aids.  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  Weight has been the same over the last 2 years.  Never had a previous sleep study. He has a history of hypertension, controlled on antihypertensives.  He has prediabetes.  No history of stroke.  He is a never smoker.  Rarely drinks alcohol.  He drinks around 2 pots of coffee a day.  Lives at home with his wife.  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 [place] 12  09/13/2022: Ov with Clora Ohmer NP for follow up to discuss sleep study results which revealed moderate obstructive sleep apnea. He feels unchanged compared to his last visit. Has a lot of trouble with daytime tiredness and poor quality sleep at night. He would like to discuss treatment options. He denies any morning headaches or drowsy driving.    12/06/2022: Today - follow up Patient presents today for follow up after being started on CPAP. He is doing really well with this. Sleeping better at night. He still feels tired some days but overall, energy levels are improving. He is wearing a full face mask. Denies any drowsy driving, sleep parasomnias/paralysis. Not having any issues with his current settings.  11/06/2022-12/05/2022: CPAP 5-20 cmH2O 30/30 days; 100% >4 hr; average use 7 hr 53 min Pressure 95th 13.5 Leaks 95th 12.1 AHI 0.7  Allergies  Allergen Reactions   Colchicine Other (See Comments)    Immunization History  Administered Date(s) Administered   Tdap 09/23/2020    Past Medical History:  Diagnosis Date   Allergy    Arthritis    Chickenpox    Frequent headaches    Gout    Hypertension    Migraines    Thyroid disease     Tobacco History: Social History   Tobacco Use  Smoking Status Never  Smokeless Tobacco Never   Counseling given: Not Answered   Outpatient Medications Prior to Visit  Medication Sig Dispense Refill   allopurinol (ZYLOPRIM) 300 MG tablet      cetirizine (ZYRTEC) 10 MG tablet Take 10 mg by mouth daily.     fluticasone (FLONASE) 50 MCG/ACT nasal spray Place 2 sprays into both nostrils daily. 16 g 0   levothyroxine (SYNTHROID) 112  MCG tablet TAKE 1 TABLET BY MOUTH EVERY MORNING ON AN EMPTY STOMACH WITH WATER ONLY. NO FOOD OR OTHER MEDICATIONS FOR 30 MINUTES. 90 tablet 2   losartan-hydrochlorothiazide (HYZAAR) 100-25 MG tablet TAKE 1 TABLET BY MOUTH DAILY FOR BLOOD PRESSURE 90 tablet 2   meloxicam (MOBIC) 15 MG tablet TAKE 1 TABLET BY MOUTH EVERY DAY 30 tablet 0   No facility-administered medications prior to visit.     Review of Systems:   Constitutional: No weight loss or gain, night sweats, fevers, chills, or lassitude. +daytime fatigue (improved) HEENT: No headaches, difficulty swallowing, tooth/dental problems, or sore throat. No itching, ear ache. +occasional nasal  congestion, sneezing CV:  No chest pain, orthopnea, PND, swelling in lower extremities, anasarca, dizziness, palpitations, syncope Resp: +occasional shortness of breath with exertion. No excess mucus or change in color of mucus. No productive or non-productive. No hemoptysis. No wheezing.  No chest wall deformity GI:  No heartburn, indigestion Skin: No rash, lesions, ulcerations MSK:  No joint pain or swelling.   Neuro: No dizziness or lightheadedness.  Psych: No depression or anxiety. Mood stable.    Physical Exam:  BP 128/78   Pulse 75   Ht 5' 6.5" (1.689 m)   Wt 291 lb (132 kg)   SpO2 98%   BMI 46.26 kg/m   GEN: Pleasant, interactive, well-appearing; obese; in no acute distress. HEENT:  Normocephalic and atraumatic. PERRLA. Sclera white. Nasal turbinates pink, moist and patent bilaterally. No rhinorrhea present. Oropharynx pink and moist, without exudate or edema. No lesions, ulcerations, or postnasal drip. Mallampati III/IV NECK:  Supple w/ fair ROM. No JVD present. Normal carotid impulses w/o bruits. Thyroid symmetrical with no goiter or nodules palpated. No lymphadenopathy.   CV: RRR, no m/r/g, no peripheral edema. Pulses intact, +2 bilaterally. No cyanosis, pallor or clubbing. PULMONARY:  Unlabored, regular breathing. Clear bilaterally A&P w/o wheezes/rales/rhonchi. No accessory muscle use.  GI: BS present and normoactive. Soft, non-tender to palpation. No organomegaly or masses detected.  MSK: No erythema, warmth or tenderness. Cap refil <2 sec all extrem. No deformities or joint swelling noted.  Neuro: A/Ox3. No focal deficits noted.   Skin: Warm, no lesions or rashe Psych: Normal affect and behavior. Judgement and thought content appropriate.     Lab Results:  CBC    Component Value Date/Time   WBC 6.4 09/24/2021 0901   RBC 4.30 09/24/2021 0901   HGB 13.1 09/24/2021 0901   HCT 39.8 09/24/2021 0901   PLT 305.0 09/24/2021 0901   MCV 92.7 09/24/2021 0901   MCHC  33.0 09/24/2021 0901   RDW 13.2 09/24/2021 0901    BMET    Component Value Date/Time   NA 138 09/28/2022 0834   K 4.1 09/28/2022 0834   CL 101 09/28/2022 0834   CO2 25 09/28/2022 0834   GLUCOSE 147 (H) 09/28/2022 0834   BUN 16 09/28/2022 0834   CREATININE 0.78 09/28/2022 0834   CALCIUM 10.0 09/28/2022 0834    BNP No results found for: "BNP"   Imaging:  No results found.  Administration History     None           No data to display          No results found for: "NITRICOXIDE"      Assessment & Plan:   Moderate obstructive sleep apnea Moderate OSA on CPAP. Excellent compliance and control. He is receiving benefit from use. Understands proper use/care of device. Aware of safe driving practices.   Patient  Instructions  Continue CPAP, every night, minimum of 4-6 hours a night.  Change equipment as directed. Wash your tubing with warm soap and water daily, hang to dry. Wash humidifier portion weekly. Use bottled, distilled water and change daily  Be aware of reduced alertness and do not drive or operate heavy machinery if experiencing this or drowsiness.  Exercise encouraged, as tolerated. Notify if persistent daytime sleepiness occurs even with consistent use of CPAP.   Follow up in 6 months with Dr. 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 Healthy weight loss encouraged.    I spent 25 minutes of dedicated to the care of this patient on the date of this encounter to include pre-visit review of records, face-to-face time with the patient discussing conditions above, post visit ordering of testing, clinical documentation with the electronic health record, making appropriate referrals as documented, and communicating necessary findings to members of the patients care team.  Noemi Chapel, NP 12/06/2022  Pt aware and understands NP's  role.

## 2022-12-06 NOTE — Assessment & Plan Note (Signed)
BMI 46. Healthy weight loss encouraged

## 2022-12-06 NOTE — Patient Instructions (Addendum)
Continue CPAP, every night, minimum of 4-6 hours a night.  Change equipment as directed. Wash your tubing with warm soap and water daily, hang to dry. Wash humidifier portion weekly. Use bottled, distilled water and change daily  Be aware of reduced alertness and do not drive or operate heavy machinery if experiencing this or drowsiness.  Exercise encouraged, as tolerated. Notify if persistent daytime sleepiness occurs even with consistent use of CPAP.   Follow up in 6 months with Dr. Wynona Neat or Philis Nettle, or sooner, if needed

## 2022-12-06 NOTE — Assessment & Plan Note (Signed)
Moderate OSA on CPAP. Excellent compliance and control. He is receiving benefit from use. Understands proper use/care of device. Aware of safe driving practices.   Patient Instructions  Continue CPAP, every night, minimum of 4-6 hours a night.  Change equipment as directed. Wash your tubing with warm soap and water daily, hang to dry. Wash humidifier portion weekly. Use bottled, distilled water and change daily  Be aware of reduced alertness and do not drive or operate heavy machinery if experiencing this or drowsiness.  Exercise encouraged, as tolerated. Notify if persistent daytime sleepiness occurs even with consistent use of CPAP.   Follow up in 6 months with Dr. Wynona Neat or Philis Nettle, or sooner, if needed

## 2022-12-11 ENCOUNTER — Other Ambulatory Visit: Payer: Self-pay | Admitting: Primary Care

## 2022-12-11 DIAGNOSIS — I10 Essential (primary) hypertension: Secondary | ICD-10-CM

## 2022-12-14 ENCOUNTER — Encounter: Payer: Self-pay | Admitting: Primary Care

## 2022-12-14 ENCOUNTER — Ambulatory Visit: Payer: BC Managed Care – PPO | Admitting: Primary Care

## 2022-12-14 VITALS — BP 134/86 | HR 80 | Temp 98.2°F | Ht 66.5 in | Wt 295.0 lb

## 2022-12-14 DIAGNOSIS — R41840 Attention and concentration deficit: Secondary | ICD-10-CM | POA: Diagnosis not present

## 2022-12-14 MED ORDER — CITALOPRAM HYDROBROMIDE 10 MG PO TABS
10.0000 mg | ORAL_TABLET | Freq: Every day | ORAL | 0 refills | Status: DC
Start: 2022-12-14 — End: 2023-06-06

## 2022-12-14 NOTE — Assessment & Plan Note (Addendum)
Symptoms seem a mix of anxiety and ADHD, especially obsessive thoughts, worrying, restlessness, etc. Discussed this with patient today. Recommended to switch to decaf coffee or significantly reduce his consumption daily.  Will refer for formal ADHD testing. Referral placed.  Discussed other options for treatment including anxiety treatment.  He agrees to proceed.   Start citalopram 10 mg daily.  Discussed potential side effects and instructions for administration. Close follow-up in 6 weeks.

## 2022-12-14 NOTE — Patient Instructions (Signed)
Start citalopram (Celexa) 10 mg once daily for anxiety.  You will either be contacted via phone regarding your referral for ADHD testing, or you may receive a letter on your MyChart portal from our referral team with instructions for scheduling an appointment. Please let us know if you have not been contacted by anyone within two weeks.   Please schedule a follow up visit for 6 weeks for follow up of anxiety/depression.  It was a pleasure to see you today!

## 2022-12-14 NOTE — Progress Notes (Signed)
Subjective:    Patient ID: Peter Fitzgerald, male    DOB: 1976-03-09, 47 y.o.   MRN: [id number]  HPI  Peter Neale is a very pleasant 47 y.o. male with a history of hypertension, sleep apnea, hypothyroidism, prediabetes, chronic gout who presents today to discuss ADHD testing.  Symptoms include difficulty focusing, "constant noise within the head", obsessive thoughts, short attention span, restlessness. Symptoms began in childhood. He was never evaluated for ADHD during childhood.   Around 1999 his now wife asked him if he had been evaluated for ADHD due to symptoms mentioned above. Over the years he's attempted to control his symptoms through a rigid and strict working schedule to prevent him from straying off task. Over the last few years his ability to control his symptoms has become difficult.   Several days ago he had a "meltdown" after a pulmonology appointment, "I felt so alone". He feels like he's losing control, feeling overwhelmed. He obsesses over appointments or scenarios that do not go his way. He denies feeling down/depressed.  He drinks 2 pots of caffeinated coffee daily.     12/14/2022   11:53 AM  GAD 7 : Generalized Anxiety Score  Nervous, Anxious, on Edge 0  Control/stop worrying 1  Worry too much - different things 1  Trouble relaxing 2  Restless 0  Easily annoyed or irritable 1  Afraid - awful might happen 0  Total GAD 7 Score 5  Anxiety Difficulty Somewhat difficult      Review of Systems  Respiratory:  Negative for shortness of breath.   Cardiovascular:  Negative for chest pain.  Psychiatric/Behavioral:  Positive for decreased concentration. The patient is nervous/anxious.          Past Medical History:  Diagnosis Date   Allergy    Arthritis    Chickenpox    Frequent headaches    Gout    Hypertension    Migraines    Thyroid disease     Social History   Socioeconomic History   Marital status: Married    Spouse name: Not on file   Number of  children: Not on file   Years of education: Not on file   Highest education level: Associate degree: occupational, Scientist, product/process development, or vocational program  Occupational History   Not on file  Tobacco Use   Smoking status: Never   Smokeless tobacco: Never  Vaping Use   Vaping status: Never Used  Substance and Sexual Activity   Alcohol use: Not Currently   Drug use: Never   Sexual activity: Not on file  Other Topics Concern   Not on file  Social History Narrative   Married.   One child.   Works as a Engineer, production.   Social Determinants of Health   Financial Resource Strain: Low Risk  (12/13/2022)   Overall Financial Resource Strain (CARDIA)    Difficulty of Paying Living Expenses: Not hard at all  Food Insecurity: No Food Insecurity (12/13/2022)   Hunger Vital Sign    Worried About Running Out of Food in the Last Year: Never true    Ran Out of Food in the Last Year: Never true  Transportation Needs: No Transportation Needs (12/13/2022)   PRAPARE - Administrator, Civil Service (Medical): No    Lack of Transportation (Non-Medical): No  Physical Activity: Unknown (12/13/2022)   Exercise Vital Sign    Days of Exercise per Week: 3 days    Minutes of Exercise per Session: Patient declined  Stress:  No Stress Concern Present (12/13/2022)   [employer] of Occupational Health - Occupational Stress Questionnaire    Feeling of Stress : Only a little  Social Connections: Socially Isolated (12/13/2022)   Social Connection and Isolation Panel [NHANES]    Frequency of Communication with Friends and Family: Once a week    Frequency of Social Gatherings with Friends and Family: Once a week    Attends Religious Services: Never    Database administrator or Organizations: No    Attends Engineer, structural: Not on file    Marital Status: Married  Catering manager Violence: Not on file    Past Surgical History:  Procedure Laterality Date   LAMINECTOMY AND MICRODISCECTOMY LUMBAR  SPINE  12/2018   MINOR CARPAL TUNNEL  1997    Family History  Problem Relation Age of Onset   Alcohol abuse Mother    Arthritis Mother    COPD Mother    Drug abuse Mother    Early death Mother        untreated COPD, CKD   Kidney disease Mother        On dialysis    Learning disabilities Mother    Alcohol abuse Father    Drug abuse Father    Early death Father        Drowning    Allergies  Allergen Reactions   Colchicine Other (See Comments)    Current Outpatient Medications on File Prior to Visit  Medication Sig Dispense Refill   allopurinol (ZYLOPRIM) 300 MG tablet      cetirizine (ZYRTEC) 10 MG tablet Take 10 mg by mouth daily.     fluticasone (FLONASE) 50 MCG/ACT nasal spray Place 2 sprays into both nostrils daily. 16 g 0   levothyroxine (SYNTHROID) 112 MCG tablet TAKE 1 TABLET BY MOUTH EVERY MORNING ON AN EMPTY STOMACH WITH WATER ONLY. NO FOOD OR OTHER MEDICATIONS FOR 30 MINUTES. 90 tablet 2   losartan-hydrochlorothiazide (HYZAAR) 100-25 MG tablet TAKE 1 TABLET BY MOUTH EVERY DAY FOR BLOOD PRESSURE 90 tablet 2   meloxicam (MOBIC) 15 MG tablet TAKE 1 TABLET BY MOUTH EVERY DAY 30 tablet 0   No current facility-administered medications on file prior to visit.    BP 134/86   Pulse 80   Temp 98.2 F (36.8 C) (Temporal)   Ht 5' 6.5" (1.689 m)   Wt 295 lb (133.8 kg)   SpO2 98%   BMI 46.90 kg/m  Objective:   Physical Exam Cardiovascular:     Rate and Rhythm: Normal rate and regular rhythm.  Pulmonary:     Effort: Pulmonary effort is normal.     Breath sounds: Normal breath sounds. No wheezing or rales.  Musculoskeletal:     Cervical back: Neck supple.  Skin:    General: Skin is warm and dry.  Neurological:     Mental Status: He is alert and oriented to person, place, and time.  Psychiatric:        Mood and Affect: Mood normal.     Comments: Fidgety during exam. Speaking rapidly in complete sentences           Assessment & Plan:  Difficulty  concentrating Assessment & Plan: Symptoms seem a mix of anxiety and ADHD, especially obsessive thoughts, worrying, restlessness, etc. Discussed this with patient today. Recommended to switch to decaf coffee or significantly reduce his consumption daily.  Will refer for formal ADHD testing. Referral placed.  Discussed other options for treatment including anxiety treatment.  He agrees to proceed.   Start citalopram 10 mg daily.  Discussed potential side effects and instructions for administration. Close follow-up in 6 weeks.   Orders: -     Ambulatory referral to Psychology -     Citalopram Hydrobromide; Take 1 tablet (10 mg total) by mouth daily. For anxiety  Dispense: 90 tablet; Refill: 0        Doreene Nest, NP

## 2022-12-22 DIAGNOSIS — G4733 Obstructive sleep apnea (adult) (pediatric): Secondary | ICD-10-CM | POA: Diagnosis not present

## 2022-12-28 DIAGNOSIS — M546 Pain in thoracic spine: Secondary | ICD-10-CM | POA: Diagnosis not present

## 2022-12-28 DIAGNOSIS — M5416 Radiculopathy, lumbar region: Secondary | ICD-10-CM | POA: Diagnosis not present

## 2023-01-04 DIAGNOSIS — M5416 Radiculopathy, lumbar region: Secondary | ICD-10-CM | POA: Diagnosis not present

## 2023-01-14 DIAGNOSIS — H16142 Punctate keratitis, left eye: Secondary | ICD-10-CM | POA: Diagnosis not present

## 2023-01-14 DIAGNOSIS — H16423 Pannus (corneal), bilateral: Secondary | ICD-10-CM | POA: Diagnosis not present

## 2023-01-17 DIAGNOSIS — H16423 Pannus (corneal), bilateral: Secondary | ICD-10-CM | POA: Diagnosis not present

## 2023-01-17 DIAGNOSIS — H16142 Punctate keratitis, left eye: Secondary | ICD-10-CM | POA: Diagnosis not present

## 2023-01-25 ENCOUNTER — Ambulatory Visit: Payer: BC Managed Care – PPO | Admitting: Primary Care

## 2023-01-25 VITALS — BP 142/88 | HR 78 | Temp 97.8°F | Ht 66.5 in | Wt 290.0 lb

## 2023-01-25 DIAGNOSIS — R41840 Attention and concentration deficit: Secondary | ICD-10-CM

## 2023-01-25 NOTE — Progress Notes (Signed)
Established Patient Office Visit  Subjective   Patient ID: Peter Fitzgerald, male    DOB: 27-May-1975  Age: 47 y.o. MRN: [id number]  Chief Complaint  Patient presents with   ADHD    HPI  Peter Renton is a very pleasant 47 y.o. male with a history of hypertension, sleep apnea, hypothyroidism, prediabetes, chronic gout, chronic back pain, arthritis who presents today for follow up discussion of ADHD testing and anxiety.   He was last evaluated in office on 12/14/2022. He was referred for ADHD testing and is now on the waitlist for this. He was initiated on Citalopram 10 mg for anxiety. He did note side effects of a headache and muscle soreness 3 days after initiating Citalopram 10 mg, but his symptoms resolved within 1 week. He has been taking the medication consistently. He has reduced his caffeine intake from 2 pots of caffeinated coffee to 1-1.5 pots per day. He has also cut back his hours at work from 70 hours weekly to 40-50 hours. He wears his CPAP at night which has helped his sleep.   He endorses a noticeable difference in his symptoms since beginning Citalopram. The "noise and voices" in his head and internal dialogue are mellowed and he is less distracted. There are less thoughts competing for his attention and he can concentrate better on tasks. He feels the medication has helped calm down the noise without making him drowsy. His wife and daughter have also noticed a difference. He denies SI, sexual dysfunction, dizziness, nausea, diarrhea.    Review of Systems  Constitutional:  Negative for chills, fever and malaise/fatigue.  Eyes:  Negative for blurred vision and double vision.  Respiratory:  Negative for cough and shortness of breath.   Cardiovascular:  Negative for chest pain.  Gastrointestinal:  Negative for diarrhea, nausea and vomiting.  Musculoskeletal:  Positive for back pain. Negative for myalgias.       Chronic back pain, history of back surgery  Neurological:  Negative for  dizziness, sensory change, weakness and headaches.  Psychiatric/Behavioral:  Negative for depression, hallucinations and suicidal ideas. The patient is nervous/anxious.        He feels his symptoms are greatly improved compared to last visit     Objective:     BP (!) 142/88   Pulse 78   Temp 97.8 F (36.6 C) (Temporal)   Ht 5' 6.5" (1.689 m)   Wt 290 lb (131.5 kg)   SpO2 99%   BMI 46.11 kg/m   BP Readings from Last 3 Encounters:  01/25/23 (!) 142/88  12/14/22 134/86  12/06/22 128/78   Wt Readings from Last 3 Encounters:  01/25/23 290 lb (131.5 kg)  12/14/22 295 lb (133.8 kg)  12/06/22 291 lb (132 kg)    Physical Exam Constitutional:      Appearance: Normal appearance.  Cardiovascular:     Rate and Rhythm: Normal rate and regular rhythm.  Pulmonary:     Effort: Pulmonary effort is normal.     Breath sounds: Normal breath sounds.  Skin:    General: Skin is warm and dry.  Neurological:     General: No focal deficit present.     Mental Status: He is alert and oriented to person, place, and time.  Psychiatric:        Mood and Affect: Mood normal.        Behavior: Behavior normal.    No results found for any visits on 01/25/23.    The 10-year ASCVD risk score (  Arnett DK, et al., 2019) is: 3.5%    Assessment & Plan:   Problem List Items Addressed This Visit       Other   Difficulty concentrating - Primary    Improved.  Continue Citalopram 10 mg daily.   Discussed continuing to reduce caffeine intake and working on diet and lifestyle changes.   Follow up with annual physical.        No follow-ups on file.    Benito Mccreedy, RN

## 2023-01-25 NOTE — Patient Instructions (Addendum)
Continue Citalopram 10 mg daily.   Schedule annual physical.   It was a pleasure to see you today!

## 2023-01-25 NOTE — Assessment & Plan Note (Addendum)
Improved, suspect the majority of symptoms were secondary to anxiety.   Continue Citalopram 10 mg daily.   Discussed continuing to reduce caffeine intake and working on diet and lifestyle changes.   Follow up with annual physical.  Complete ADHD testing for which is pending.   I evaluated patient, was consulted regarding treatment, and agree with assessment and plan per Tenna Delaine, RN, DNP student.   Mayra Reel, NP-C

## 2023-01-25 NOTE — Progress Notes (Signed)
//  Subjective:    Patient ID: Peter Fitzgerald, male    DOB: [birth year], 47 y.o.   MRN: [id number]  HPI  Peter Fitzgerald is a very pleasant 47 y.o. male with a history of hypertension, OSA, hypothyroidism, prediabetes, chronic gout, difficulty concentrating who presents today for follow up of anxiety and difficulty concentrating. He would also like to discuss lactose intolerance.   He was last evaluated on 12/14/22 for symptoms of difficulty focusing, obsessive thoughts, short attention span, restlessness, feeling overwhelmed. Also mentioned that he drinks 2 pots of caffeine coffee daily. During this visit we initiated citalopram at 10 mg daily and placed a referral for formal ADHD testing.   Several days later he contacted [place] via MyChart with reports of worsening headache, muscle/joint pain. He was instructed to reduce citalopram to 5 mg daily x 1 week, then increase to 10 mg daily thereafter.    Since his last visit his muscle aches/joint aches and headaches have resolved. He's overall feeling better. The internal dialogue in his mind has improved, feels more focused, feels less distracted. His family has noticed improvement. He's reduced his caffeine intake to 1.5 pots of coffee daily. He has also reduced his overtime at work which has helped.   BP Readings from Last 3 Encounters:  01/25/23 (!) 142/88  12/14/22 134/86  12/06/22 128/78   Wt Readings from Last 3 Encounters:  01/25/23 290 lb (131.5 kg)  12/14/22 295 lb (133.8 kg)  12/06/22 291 lb (132 kg)      Review of Systems  Gastrointestinal:  Negative for abdominal pain and diarrhea.  Musculoskeletal:  Positive for arthralgias.  Neurological:  Negative for headaches.  Psychiatric/Behavioral:  The patient is nervous/anxious.        See HPI         Past Medical History:  Diagnosis Date   Allergy    Arthritis    Chickenpox    Frequent headaches    Gout    Hypertension    Migraines    Thyroid disease     Social History    Socioeconomic History   Marital status: Married    Spouse name: Not on file   Number of children: Not on file   Years of education: Not on file   Highest education level: Associate degree: occupational, Scientist, product/process development, or vocational program  Occupational History   Not on file  Tobacco Use   Smoking status: Never   Smokeless tobacco: Never  Vaping Use   Vaping status: Never Used  Substance and Sexual Activity   Alcohol use: Not Currently   Drug use: Never   Sexual activity: Not on file  Other Topics Concern   Not on file  Social History Narrative   Married.   One child.   Works as a Engineer, production.   Social Determinants of Health   Financial Resource Strain: Low Risk  (01/25/2023)   Overall Financial Resource Strain (CARDIA)    Difficulty of Paying Living Expenses: Not hard at all  Food Insecurity: No Food Insecurity (01/25/2023)   Hunger Vital Sign    Worried About Running Out of Food in the Last Year: Never true    Ran Out of Food in the Last Year: Never true  Transportation Needs: No Transportation Needs (01/25/2023)   PRAPARE - Administrator, Civil Service (Medical): No    Lack of Transportation (Non-Medical): No  Physical Activity: Unknown (01/25/2023)   Exercise Vital Sign    Days of Exercise per Week: 2  days    Minutes of Exercise per Session: Patient declined  Stress: Stress Concern Present (01/25/2023)   [employer] of Occupational Health - Occupational Stress Questionnaire    Feeling of Stress : To some extent  Social Connections: Socially Isolated (01/25/2023)   Social Connection and Isolation Panel [NHANES]    Frequency of Communication with Friends and Family: Once a week    Frequency of Social Gatherings with Friends and Family: Once a week    Attends Religious Services: Never    Database administrator or Organizations: No    Attends Engineer, structural: Not on file    Marital Status: Married  Catering manager Violence: Not on file     Past Surgical History:  Procedure Laterality Date   LAMINECTOMY AND MICRODISCECTOMY LUMBAR SPINE  12/2018   MINOR CARPAL TUNNEL  1997    Family History  Problem Relation Age of Onset   Alcohol abuse Mother    Arthritis Mother    COPD Mother    Drug abuse Mother    Early death Mother        untreated COPD, CKD   Kidney disease Mother        On dialysis    Learning disabilities Mother    Alcohol abuse Father    Drug abuse Father    Early death Father        Drowning    Allergies  Allergen Reactions   Colchicine Other (See Comments)    Current Outpatient Medications on File Prior to Visit  Medication Sig Dispense Refill   allopurinol (ZYLOPRIM) 300 MG tablet      cetirizine (ZYRTEC) 10 MG tablet Take 10 mg by mouth daily.     citalopram (CELEXA) 10 MG tablet Take 1 tablet (10 mg total) by mouth daily. For anxiety 90 tablet 0   fluticasone (FLONASE) 50 MCG/ACT nasal spray Place 2 sprays into both nostrils daily. 16 g 0   levothyroxine (SYNTHROID) 112 MCG tablet TAKE 1 TABLET BY MOUTH EVERY MORNING ON AN EMPTY STOMACH WITH WATER ONLY. NO FOOD OR OTHER MEDICATIONS FOR 30 MINUTES. 90 tablet 2   losartan-hydrochlorothiazide (HYZAAR) 100-25 MG tablet TAKE 1 TABLET BY MOUTH EVERY DAY FOR BLOOD PRESSURE 90 tablet 2   meloxicam (MOBIC) 15 MG tablet TAKE 1 TABLET BY MOUTH EVERY DAY 30 tablet 0   No current facility-administered medications on file prior to visit.    BP (!) 142/88   Pulse 78   Temp 97.8 F (36.6 C) (Temporal)   Ht 5' 6.5" (1.689 m)   Wt 290 lb (131.5 kg)   SpO2 99%   BMI 46.11 kg/m  Objective:   Physical Exam Cardiovascular:     Rate and Rhythm: Normal rate and regular rhythm.  Pulmonary:     Effort: Pulmonary effort is normal.     Breath sounds: Normal breath sounds.  Musculoskeletal:     Cervical back: Neck supple.  Skin:    General: Skin is warm and dry.  Neurological:     Mental Status: He is alert and oriented to person, place, and time.   Psychiatric:        Mood and Affect: Mood normal.           Assessment & Plan:  Difficulty concentrating Assessment & Plan: Improved, suspect the majority of symptoms were secondary to anxiety.   Continue Citalopram 10 mg daily.   Discussed continuing to reduce caffeine intake and working on diet and lifestyle changes.  Follow up with annual physical.  Complete ADHD testing for which is pending.   I evaluated patient, was consulted regarding treatment, and agree with assessment and plan per Tenna Delaine, RN, DNP student.   Mayra Reel, NP-C          Doreene Nest, NP

## 2023-03-20 ENCOUNTER — Other Ambulatory Visit: Payer: Self-pay | Admitting: Primary Care

## 2023-03-20 DIAGNOSIS — R41840 Attention and concentration deficit: Secondary | ICD-10-CM

## 2023-03-22 NOTE — Telephone Encounter (Signed)
Noted. Will decline refill request.  

## 2023-03-22 NOTE — Telephone Encounter (Signed)
Called to follow up with patient. Number not taking calls at this time.

## 2023-04-04 DIAGNOSIS — Z79899 Other long term (current) drug therapy: Secondary | ICD-10-CM | POA: Diagnosis not present

## 2023-04-04 DIAGNOSIS — M1991 Primary osteoarthritis, unspecified site: Secondary | ICD-10-CM | POA: Diagnosis not present

## 2023-04-04 DIAGNOSIS — M1009 Idiopathic gout, multiple sites: Secondary | ICD-10-CM | POA: Diagnosis not present

## 2023-04-12 DIAGNOSIS — M5416 Radiculopathy, lumbar region: Secondary | ICD-10-CM | POA: Diagnosis not present

## 2023-04-12 DIAGNOSIS — Z6841 Body Mass Index (BMI) 40.0 and over, adult: Secondary | ICD-10-CM | POA: Diagnosis not present

## 2023-04-26 DIAGNOSIS — M5416 Radiculopathy, lumbar region: Secondary | ICD-10-CM | POA: Diagnosis not present

## 2023-05-03 DIAGNOSIS — E790 Hyperuricemia without signs of inflammatory arthritis and tophaceous disease: Secondary | ICD-10-CM | POA: Diagnosis not present

## 2023-05-03 DIAGNOSIS — Z79899 Other long term (current) drug therapy: Secondary | ICD-10-CM | POA: Diagnosis not present

## 2023-05-24 DIAGNOSIS — F909 Attention-deficit hyperactivity disorder, unspecified type: Secondary | ICD-10-CM | POA: Diagnosis not present

## 2023-05-24 DIAGNOSIS — F419 Anxiety disorder, unspecified: Secondary | ICD-10-CM | POA: Diagnosis not present

## 2023-06-06 ENCOUNTER — Ambulatory Visit: Payer: BC Managed Care – PPO | Admitting: Nurse Practitioner

## 2023-06-06 ENCOUNTER — Encounter: Payer: Self-pay | Admitting: Nurse Practitioner

## 2023-06-06 VITALS — BP 110/84 | HR 68 | Ht 66.0 in | Wt 284.0 lb

## 2023-06-06 DIAGNOSIS — F909 Attention-deficit hyperactivity disorder, unspecified type: Secondary | ICD-10-CM | POA: Diagnosis not present

## 2023-06-06 DIAGNOSIS — G4733 Obstructive sleep apnea (adult) (pediatric): Secondary | ICD-10-CM | POA: Diagnosis not present

## 2023-06-06 DIAGNOSIS — Z6841 Body Mass Index (BMI) 40.0 and over, adult: Secondary | ICD-10-CM

## 2023-06-06 DIAGNOSIS — E66813 Obesity, class 3: Secondary | ICD-10-CM | POA: Diagnosis not present

## 2023-06-06 DIAGNOSIS — F419 Anxiety disorder, unspecified: Secondary | ICD-10-CM | POA: Diagnosis not present

## 2023-06-06 NOTE — Patient Instructions (Addendum)
 Continue to use CPAP every night, minimum of 4-6 hours a night.  Change equipment as directed. Wash your tubing with warm soap and water daily, hang to dry. Wash humidifier portion weekly. Use bottled, distilled water and change daily Be aware of reduced alertness and do not drive or operate heavy machinery if experiencing this or drowsiness.  Exercise encouraged, as tolerated. Healthy weight management discussed.  Avoid or decrease alcohol consumption and medications that make you more sleepy, if possible. Notify if persistent daytime sleepiness occurs even with consistent use of PAP therapy.  We discussed how untreated sleep apnea puts an individual at risk for cardiac arrhthymias, pulm HTN, DM, stroke and increases their risk for daytime accidents.   Follow up in 1 year with Peter Neeti Knudtson,NP, or sooner, if needed

## 2023-06-06 NOTE — Assessment & Plan Note (Signed)
 Moderate OSA on CPAP. Excellent compliance and control. Receives benefit from use. Aware of risks of untreated moderate OSA. Encouraged to continue utilizing nightly. Aware of proper care/use of device. Safe driving practices reviewed.   Patient Instructions  Continue to use CPAP every night, minimum of 4-6 hours a night.  Change equipment as directed. Wash your tubing with warm soap and water daily, hang to dry. Wash humidifier portion weekly. Use bottled, distilled water and change daily Be aware of reduced alertness and do not drive or operate heavy machinery if experiencing this or drowsiness.  Exercise encouraged, as tolerated. Healthy weight management discussed.  Avoid or decrease alcohol consumption and medications that make you more sleepy, if possible. Notify if persistent daytime sleepiness occurs even with consistent use of PAP therapy.  We discussed how untreated sleep apnea puts an individual at risk for cardiac arrhthymias, pulm HTN, DM, stroke and increases their risk for daytime accidents.   Follow up in 1 year with Peter Synai Prettyman,NP, or sooner, if needed

## 2023-06-06 NOTE — Progress Notes (Signed)
 @Patient  ID: Peter Fitzgerald, male    DOB: 1975-12-17, 48 y.o.   MRN: [id number]  Chief Complaint  Patient presents with   Follow-up    Patient states he is doing good     Referring provider: Doreene Nest, NP  HPI: 48 year old male, never smoker followed for sleep apnea.  Past medical history significant for hypertension, hypothyroidism, prediabetes, gout.  TEST/EVENTS:  08/15/2022 HST: AHI 27.5/h, SpO2 low 63%  07/29/2022: Ov with Mirca Yale NP for sleep consult, referred by Vernona Rieger, NP.  He has a long history of snoring.  He will wake himself up gasping for air at times.  Feels very tired during the day.  Typically wakes up feeling groggy.  He has morning headaches and dry mouth pretty much every morning.  Some nights he can take him up to an hour to fall asleep.  He usually is waking frequently throughout the night.  Feels like he is never well rested.  Denies any drowsy driving, sleep parasomnia/paralysis.  No history of narcolepsy or symptoms of cataplexy. Goes to bed between 9 to 10 PM.  Typically falls asleep within 10 to 15 minutes but sometimes takes longer.  Wakes several times at night.  Officially gets up around 4 to 6 AM.  No sleep aids.  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  Weight has been the same over the last 2 years.  Never had a previous sleep study. He has a history of hypertension, controlled on antihypertensives.  He has prediabetes.  No history of stroke.  He is a never smoker.  Rarely drinks alcohol.  He drinks around 2 pots of coffee a day.  Lives at home with his wife.  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 [place] 12  09/13/2022: Ov with Garik Diamant NP for follow up to discuss sleep study results which revealed moderate obstructive sleep apnea. He feels unchanged compared to his last visit. Has a lot of trouble with daytime tiredness and poor quality sleep at night. He would like to discuss treatment options. He denies any morning headaches or  drowsy driving.   12/06/2022: Ov with Makalya Nave NP for follow up after being started on CPAP. He is doing really well with this. Sleeping better at night. He still feels tired some days but overall, energy levels are improving. He is wearing a full face mask. Denies any drowsy driving, sleep parasomnias/paralysis. Not having any issues with his current settings. 11/06/2022-12/05/2022: CPAP 5-20 cmH2O 30/30 days; 100% >4 hr; average use 7 hr 53 min Pressure 95th 13.5 Leaks 95th 12.1 AHI 0.7  06/06/2023: Today - follow up Patient presents today for follow up. He has been doing well since his last visit. CPAP is working great for him. Sleeps well at night and feels rested during the day. Doesn't have any issues with leaks or mask fit. No trouble with daytime tiredness. No drowsy driving, sleep parasomnias/paralysis.   05/04/2023-06/02/2023: CPAP 5-20 cmH2O 30/30 days; 97% >4 hr; average use 7 hr 24 min Pressure 95th 12.1 Leaks 95th 4.9 AHI 0.7  Allergies  Allergen Reactions   Colchicine Other (See Comments)    Immunization History  Administered Date(s) Administered   Tdap 09/23/2020    Past Medical History:  Diagnosis Date   Allergy    Arthritis    Chickenpox    Frequent headaches    Gout    Hypertension    Migraines    Thyroid disease     Tobacco History:  Social History   Tobacco Use  Smoking Status Never  Smokeless Tobacco Never   Counseling given: Not Answered   Outpatient Medications Prior to Visit  Medication Sig Dispense Refill   allopurinol (ZYLOPRIM) 300 MG tablet      atomoxetine (STRATTERA) 40 MG capsule Take 40 mg by mouth daily.     cetirizine (ZYRTEC) 10 MG tablet Take 10 mg by mouth daily.     fluticasone (FLONASE) 50 MCG/ACT nasal spray Place 2 sprays into both nostrils daily. 16 g 0   levothyroxine (SYNTHROID) 112 MCG tablet TAKE 1 TABLET BY MOUTH EVERY MORNING ON AN EMPTY STOMACH WITH WATER ONLY. NO FOOD OR OTHER MEDICATIONS FOR 30 MINUTES. 90 tablet 2    losartan-hydrochlorothiazide (HYZAAR) 100-25 MG tablet TAKE 1 TABLET BY MOUTH EVERY DAY FOR BLOOD PRESSURE 90 tablet 2   meloxicam (MOBIC) 15 MG tablet TAKE 1 TABLET BY MOUTH EVERY DAY 30 tablet 0   citalopram (CELEXA) 10 MG tablet Take 1 tablet (10 mg total) by mouth daily. For anxiety (Patient not taking: Reported on 06/06/2023) 90 tablet 0   No facility-administered medications prior to visit.     Review of Systems:   Constitutional: No weight loss or gain, night sweats, fevers, chills, fatigue or lassitude.  HEENT: No headaches, difficulty swallowing, tooth/dental problems, or sore throat. No itching, ear ache. +occasional nasal congestion, sneezing CV:  No chest pain, orthopnea, PND, swelling in lower extremities, anasarca, dizziness, palpitations, syncope Resp: +baseline occasional shortness of breath with exertion. No excess mucus or change in color of mucus. No productive or non-productive. No hemoptysis. No wheezing.  No chest wall deformity GI:  No heartburn, indigestion Skin: No rash, lesions, ulcerations MSK:  No joint pain or swelling.   Neuro: No dizziness or lightheadedness.  Psych: No depression or anxiety. Mood stable.    Physical Exam:  BP 110/84 (BP Location: Right Arm, Patient Position: Sitting, Cuff Size: Large)   Pulse 68   Ht 5\' 6"  (1.676 m)   Wt 284 lb (128.8 kg)   SpO2 98%   BMI 45.84 kg/m   GEN: Pleasant, interactive, well-appearing; obese; in no acute distress. HEENT:  Normocephalic and atraumatic. PERRLA. Sclera white. Nasal turbinates pink, moist and patent bilaterally. No rhinorrhea present. Oropharynx pink and moist, without exudate or edema. No lesions, ulcerations, or postnasal drip. Mallampati III/IV NECK:  Supple w/ fair ROM. No JVD present. Normal carotid impulses w/o bruits. Thyroid symmetrical with no goiter or nodules palpated. No lymphadenopathy.   CV: RRR, no m/r/g, no peripheral edema. Pulses intact, +2 bilaterally. No cyanosis, pallor or  clubbing. PULMONARY:  Unlabored, regular breathing. Clear bilaterally A&P w/o wheezes/rales/rhonchi. No accessory muscle use.  GI: BS present and normoactive. Soft, non-tender to palpation. No organomegaly or masses detected.  MSK: No erythema, warmth or tenderness. Cap refil <2 sec all extrem. No deformities or joint swelling noted.  Neuro: A/Ox3. No focal deficits noted.   Skin: Warm, no lesions or rashe Psych: Normal affect and behavior. Judgement and thought content appropriate.     Lab Results:  CBC    Component Value Date/Time   WBC 6.4 09/24/2021 0901   RBC 4.30 09/24/2021 0901   HGB 13.1 09/24/2021 0901   HCT 39.8 09/24/2021 0901   PLT 305.0 09/24/2021 0901   MCV 92.7 09/24/2021 0901   MCHC 33.0 09/24/2021 0901   RDW 13.2 09/24/2021 0901    BMET    Component Value Date/Time   NA 138 09/28/2022 0834   K  4.1 09/28/2022 0834   CL 101 09/28/2022 0834   CO2 25 09/28/2022 0834   GLUCOSE 147 (H) 09/28/2022 0834   BUN 16 09/28/2022 0834   CREATININE 0.78 09/28/2022 0834   CALCIUM 10.0 09/28/2022 0834    BNP No results found for: "BNP"   Imaging:  No results found.  Administration History     None           No data to display          No results found for: "NITRICOXIDE"      Assessment & Plan:   Moderate obstructive sleep apnea Moderate OSA on CPAP. Excellent compliance and control. Receives benefit from use. Aware of risks of untreated moderate OSA. Encouraged to continue utilizing nightly. Aware of proper care/use of device. Safe driving practices reviewed.   Patient Instructions  Continue to use CPAP every night, minimum of 4-6 hours a night.  Change equipment as directed. Wash your tubing with warm soap and water daily, hang to dry. Wash humidifier portion weekly. Use bottled, distilled water and change daily Be aware of reduced alertness and do not drive or operate heavy machinery if experiencing this or drowsiness.  Exercise encouraged,  as tolerated. Healthy weight management discussed.  Avoid or decrease alcohol consumption and medications that make you more sleepy, if possible. Notify if persistent daytime sleepiness occurs even with consistent use of PAP therapy.  We discussed how untreated sleep apnea puts an individual at risk for cardiac arrhthymias, pulm HTN, DM, stroke and increases their risk for daytime accidents.   Follow up in 1 year with Katie Ferguson Gertner,NP, or sooner, if needed    Class 3 severe obesity due to excess calories with body mass index (BMI) of 45.0 to 49.9 in adult (HCC) BMI 45. Healthy weight loss encouraged    I spent 25 minutes of dedicated to the care of this patient on the date of this encounter to include pre-visit review of records, face-to-face time with the patient discussing conditions above, post visit ordering of testing, clinical documentation with the electronic health record, making appropriate referrals as documented, and communicating necessary findings to members of the patients care team.  Noemi Chapel, NP 06/06/2023  Pt aware and understands NP's role.

## 2023-06-06 NOTE — Assessment & Plan Note (Signed)
BMI 45. Healthy weight loss encouraged.  

## 2023-06-20 DIAGNOSIS — F909 Attention-deficit hyperactivity disorder, unspecified type: Secondary | ICD-10-CM | POA: Diagnosis not present

## 2023-06-20 DIAGNOSIS — F419 Anxiety disorder, unspecified: Secondary | ICD-10-CM | POA: Diagnosis not present

## 2023-06-21 DIAGNOSIS — F419 Anxiety disorder, unspecified: Secondary | ICD-10-CM | POA: Diagnosis not present

## 2023-06-21 DIAGNOSIS — F909 Attention-deficit hyperactivity disorder, unspecified type: Secondary | ICD-10-CM | POA: Diagnosis not present

## 2023-07-19 DIAGNOSIS — F909 Attention-deficit hyperactivity disorder, unspecified type: Secondary | ICD-10-CM | POA: Diagnosis not present

## 2023-07-19 DIAGNOSIS — F419 Anxiety disorder, unspecified: Secondary | ICD-10-CM | POA: Diagnosis not present

## 2023-07-27 DIAGNOSIS — M546 Pain in thoracic spine: Secondary | ICD-10-CM | POA: Diagnosis not present

## 2023-07-27 DIAGNOSIS — M5416 Radiculopathy, lumbar region: Secondary | ICD-10-CM | POA: Diagnosis not present

## 2023-08-09 DIAGNOSIS — M5416 Radiculopathy, lumbar region: Secondary | ICD-10-CM | POA: Diagnosis not present

## 2023-08-10 ENCOUNTER — Telehealth: Payer: Self-pay | Admitting: Student

## 2023-08-10 NOTE — Telephone Encounter (Signed)
 CMN rec'd for CPAP supplies. Will fwd to Ms. Cobb for signature.

## 2023-08-16 DIAGNOSIS — F909 Attention-deficit hyperactivity disorder, unspecified type: Secondary | ICD-10-CM | POA: Diagnosis not present

## 2023-08-16 DIAGNOSIS — F419 Anxiety disorder, unspecified: Secondary | ICD-10-CM | POA: Diagnosis not present

## 2023-08-16 NOTE — Telephone Encounter (Signed)
 Rc'd sign CRM will fax to [phone] and wait for fax confirmation

## 2023-09-11 ENCOUNTER — Other Ambulatory Visit: Payer: Self-pay | Admitting: Primary Care

## 2023-09-11 DIAGNOSIS — E039 Hypothyroidism, unspecified: Secondary | ICD-10-CM

## 2023-09-13 DIAGNOSIS — F419 Anxiety disorder, unspecified: Secondary | ICD-10-CM | POA: Diagnosis not present

## 2023-09-13 DIAGNOSIS — F909 Attention-deficit hyperactivity disorder, unspecified type: Secondary | ICD-10-CM | POA: Diagnosis not present

## 2023-09-20 DIAGNOSIS — M180 Bilateral primary osteoarthritis of first carpometacarpal joints: Secondary | ICD-10-CM | POA: Diagnosis not present

## 2023-09-20 DIAGNOSIS — M79641 Pain in right hand: Secondary | ICD-10-CM | POA: Diagnosis not present

## 2023-09-20 DIAGNOSIS — M79642 Pain in left hand: Secondary | ICD-10-CM | POA: Diagnosis not present

## 2023-09-27 DIAGNOSIS — Z79899 Other long term (current) drug therapy: Secondary | ICD-10-CM | POA: Diagnosis not present

## 2023-09-27 DIAGNOSIS — M1991 Primary osteoarthritis, unspecified site: Secondary | ICD-10-CM | POA: Diagnosis not present

## 2023-09-27 DIAGNOSIS — M1009 Idiopathic gout, multiple sites: Secondary | ICD-10-CM | POA: Diagnosis not present

## 2023-09-29 ENCOUNTER — Encounter: Payer: BC Managed Care – PPO | Admitting: Primary Care

## 2023-10-09 ENCOUNTER — Other Ambulatory Visit: Payer: Self-pay | Admitting: Primary Care

## 2023-10-09 DIAGNOSIS — I10 Essential (primary) hypertension: Secondary | ICD-10-CM

## 2023-10-09 NOTE — Telephone Encounter (Signed)
 Patient is due for CPE/follow up, this will be required prior to any further refills.  Please schedule, thank you!

## 2023-10-10 NOTE — Telephone Encounter (Signed)
 Lvmtcb, send mychart message

## 2023-10-11 DIAGNOSIS — L814 Other melanin hyperpigmentation: Secondary | ICD-10-CM | POA: Diagnosis not present

## 2023-10-11 DIAGNOSIS — D2261 Melanocytic nevi of right upper limb, including shoulder: Secondary | ICD-10-CM | POA: Diagnosis not present

## 2023-10-11 DIAGNOSIS — D2262 Melanocytic nevi of left upper limb, including shoulder: Secondary | ICD-10-CM | POA: Diagnosis not present

## 2023-10-11 DIAGNOSIS — D485 Neoplasm of uncertain behavior of skin: Secondary | ICD-10-CM | POA: Diagnosis not present

## 2023-10-11 DIAGNOSIS — L821 Other seborrheic keratosis: Secondary | ICD-10-CM | POA: Diagnosis not present

## 2023-10-11 DIAGNOSIS — Z85828 Personal history of other malignant neoplasm of skin: Secondary | ICD-10-CM | POA: Diagnosis not present

## 2023-10-12 NOTE — Telephone Encounter (Signed)
 Spoke to pt, r/s cpe for 10/18/23

## 2023-10-18 ENCOUNTER — Encounter: Payer: Self-pay | Admitting: Primary Care

## 2023-10-18 ENCOUNTER — Ambulatory Visit: Payer: Self-pay | Admitting: Primary Care

## 2023-10-18 ENCOUNTER — Ambulatory Visit (INDEPENDENT_AMBULATORY_CARE_PROVIDER_SITE_OTHER): Admitting: Primary Care

## 2023-10-18 VITALS — BP 122/80 | HR 75 | Temp 97.7°F | Ht 66.0 in | Wt 240.0 lb

## 2023-10-18 DIAGNOSIS — M546 Pain in thoracic spine: Secondary | ICD-10-CM

## 2023-10-18 DIAGNOSIS — E039 Hypothyroidism, unspecified: Secondary | ICD-10-CM | POA: Diagnosis not present

## 2023-10-18 DIAGNOSIS — Z0000 Encounter for general adult medical examination without abnormal findings: Secondary | ICD-10-CM

## 2023-10-18 DIAGNOSIS — R7303 Prediabetes: Secondary | ICD-10-CM | POA: Diagnosis not present

## 2023-10-18 DIAGNOSIS — I10 Essential (primary) hypertension: Secondary | ICD-10-CM | POA: Diagnosis not present

## 2023-10-18 DIAGNOSIS — R41840 Attention and concentration deficit: Secondary | ICD-10-CM

## 2023-10-18 DIAGNOSIS — G8929 Other chronic pain: Secondary | ICD-10-CM

## 2023-10-18 DIAGNOSIS — M1A9XX0 Chronic gout, unspecified, without tophus (tophi): Secondary | ICD-10-CM

## 2023-10-18 DIAGNOSIS — G4733 Obstructive sleep apnea (adult) (pediatric): Secondary | ICD-10-CM | POA: Diagnosis not present

## 2023-10-18 LAB — COMPREHENSIVE METABOLIC PANEL WITH GFR
ALT: 15 U/L (ref 0–53)
AST: 16 U/L (ref 0–37)
Albumin: 5.1 g/dL (ref 3.5–5.2)
Alkaline Phosphatase: 71 U/L (ref 39–117)
BUN: 11 mg/dL (ref 6–23)
CO2: 33 meq/L — ABNORMAL HIGH (ref 19–32)
Calcium: 10.2 mg/dL (ref 8.4–10.5)
Chloride: 96 meq/L (ref 96–112)
Creatinine, Ser: 0.88 mg/dL (ref 0.40–1.50)
GFR: 102.74 mL/min (ref >60.00)
Glucose, Bld: 100 mg/dL — ABNORMAL HIGH (ref 70–99)
Potassium: 4 meq/L (ref 3.5–5.1)
Sodium: 138 meq/L (ref 135–145)
Total Bilirubin: 0.7 mg/dL (ref 0.2–1.2)
Total Protein: 7.4 g/dL (ref 6.0–8.3)

## 2023-10-18 LAB — HEMOGLOBIN A1C: Hgb A1c MFr Bld: 6.2 % (ref 4.6–6.5)

## 2023-10-18 LAB — CBC
HCT: 42.8 % (ref 39.0–52.0)
Hemoglobin: 14.3 g/dL (ref 13.0–17.0)
MCHC: 33.5 g/dL (ref 30.0–36.0)
MCV: 90.8 fl (ref 78.0–100.0)
Platelets: 344 K/uL (ref 150.0–400.0)
RBC: 4.71 Mil/uL (ref 4.22–5.81)
RDW: 13.7 % (ref 11.5–15.5)
WBC: 7.2 K/uL (ref 4.0–10.5)

## 2023-10-18 LAB — LIPID PANEL
Cholesterol: 202 mg/dL — ABNORMAL HIGH (ref 0–200)
HDL: 48.9 mg/dL (ref >39.00)
LDL Cholesterol: 131 mg/dL — ABNORMAL HIGH (ref 0–99)
NonHDL: 152.77
Total CHOL/HDL Ratio: 4
Triglycerides: 111 mg/dL (ref 0.0–149.0)
VLDL: 22.2 mg/dL (ref 0.0–40.0)

## 2023-10-18 LAB — TSH: TSH: 2.62 u[IU]/mL (ref 0.35–5.50)

## 2023-10-18 NOTE — Progress Notes (Signed)
 Subjective:    Patient ID: Peter Dugo, male    DOB: 12-Apr-1975, 48 y.o.   MRN: [id number]  HPI  Peter Fitzgerald is a very pleasant 48 y.o. male who presents today for complete physical and follow up of chronic conditions.  Immunizations: -Tetanus: Completed in 2022  Diet: Fair diet. Less appetite since on Strattera Exercise: No regular exercise. Active   Eye exam: Completes annually  Dental exam: Completes semi-annually    Colonoscopy: Never completed, declined prior years.     BP Readings from Last 3 Encounters:  10/18/23 122/80  06/06/23 110/84  01/25/23 (!) 142/88   Wt Readings from Last 3 Encounters:  10/18/23 240 lb (108.9 kg)  06/06/23 284 lb (128.8 kg)  01/25/23 290 lb (131.5 kg)      Review of Systems  Constitutional:  Negative for unexpected weight change.  HENT:  Negative for rhinorrhea.   Respiratory:  Negative for cough and shortness of breath.   Cardiovascular:  Negative for chest pain.  Gastrointestinal:  Negative for constipation and diarrhea.  Genitourinary:  Negative for difficulty urinating.  Musculoskeletal:  Positive for arthralgias and back pain.  Skin:  Negative for rash.  Allergic/Immunologic: Negative for environmental allergies.  Neurological:  Negative for dizziness and headaches.  Psychiatric/Behavioral:  The patient is not nervous/anxious.          Past Medical History:  Diagnosis Date   Allergy    Arthritis    Chickenpox    Frequent headaches    Gout    Hypertension    Migraines    Thyroid  disease     Social History   Socioeconomic History   Marital status: Married    Spouse name: Not on file   Number of children: Not on file   Years of education: Not on file   Highest education level: Associate degree: occupational, Scientist, product/process development, or vocational program  Occupational History   Not on file  Tobacco Use   Smoking status: Never   Smokeless tobacco: Never  Vaping Use   Vaping status: Never Used  Substance and Sexual Activity    Alcohol use: Not Currently   Drug use: Never   Sexual activity: Not on file  Other Topics Concern   Not on file  Social History Narrative   Married.   One child.   Works as a Engineer, production.   Social Drivers of Corporate investment banker Strain: Low Risk  (10/17/2023)   Overall Financial Resource Strain (CARDIA)    Difficulty of Paying Living Expenses: Not hard at all  Food Insecurity: No Food Insecurity (10/17/2023)   Hunger Vital Sign    Worried About Running Out of Food in the Last Year: Never true    Ran Out of Food in the Last Year: Never true  Transportation Needs: No Transportation Needs (10/17/2023)   PRAPARE - Administrator, Civil Service (Medical): No    Lack of Transportation (Non-Medical): No  Physical Activity: Unknown (10/17/2023)   Exercise Vital Sign    Days of Exercise per Week: Patient declined    Minutes of Exercise per Session: Not on file  Stress: Stress Concern Present (10/17/2023)   [employer] of Occupational Health - Occupational Stress Questionnaire    Feeling of Stress: To some extent  Social Connections: Socially Isolated (10/17/2023)   Social Connection and Isolation Panel    Frequency of Communication with Friends and Family: Never    Frequency of Social Gatherings with Friends and Family: Never  Attends Religious Services: Never    Active Member of Clubs or Organizations: No    Attends Engineer, structural: Not on file    Marital Status: Married  Catering manager Violence: Not on file    Past Surgical History:  Procedure Laterality Date   LAMINECTOMY AND MICRODISCECTOMY LUMBAR SPINE  12/2018   MINOR CARPAL TUNNEL  1997    Family History  Problem Relation Age of Onset   Alcohol abuse Mother    Arthritis Mother    COPD Mother    Drug abuse Mother    Early death Mother        untreated COPD, CKD   Kidney disease Mother        On dialysis    Learning disabilities Mother    Alcohol abuse Father    Drug abuse Father     Early death Father        Drowning    Allergies  Allergen Reactions   Colchicine Other (See Comments)    Current Outpatient Medications on File Prior to Visit  Medication Sig Dispense Refill   allopurinol  (ZYLOPRIM ) 300 MG tablet      atomoxetine (STRATTERA) 60 MG capsule Take 60 mg by mouth daily.     cetirizine  (ZYRTEC ) 10 MG tablet Take 10 mg by mouth daily.     fluticasone  (FLONASE ) 50 MCG/ACT nasal spray Place 2 sprays into both nostrils daily. 16 g 0   levothyroxine  (SYNTHROID ) 112 MCG tablet TAKE 1 TABLET BY MOUTH EVERY MORNING ON AN EMPTY STOMACH WITH WATER ONLY. NO FOOD OR OTHER MEDICATIONS FOR 30 MINUTES 90 tablet 0   losartan -hydrochlorothiazide  (HYZAAR) 100-25 MG tablet TAKE 1 TABLET BY MOUTH EVERY DAY FOR BLOOD PRESSURE 90 tablet 0   No current facility-administered medications on file prior to visit.    BP 122/80   Pulse 75   Temp 97.7 F (36.5 C) (Temporal)   Ht 5' 6 (1.676 m)   Wt 240 lb (108.9 kg)   SpO2 98%   BMI 38.74 kg/m  Objective:   Physical Exam HENT:     Right Ear: Tympanic membrane and ear canal normal.     Left Ear: Tympanic membrane and ear canal normal.  Eyes:     Pupils: Pupils are equal, round, and reactive to light.  Cardiovascular:     Rate and Rhythm: Normal rate and regular rhythm.  Pulmonary:     Effort: Pulmonary effort is normal.     Breath sounds: Normal breath sounds.  Abdominal:     General: Bowel sounds are normal.     Palpations: Abdomen is soft.     Tenderness: There is no abdominal tenderness.  Musculoskeletal:        General: Normal range of motion.     Cervical back: Neck supple.  Skin:    General: Skin is warm and dry.  Neurological:     Mental Status: He is alert and oriented to person, place, and time.     Cranial Nerves: No cranial nerve deficit.     Deep Tendon Reflexes:     Reflex Scores:      Patellar reflexes are 2+ on the right side and 2+ on the left side. Psychiatric:        Mood and Affect: Mood  normal.           Assessment & Plan:  Preventative health care Assessment & Plan: Immunizations UTD. Colonoscopy overdue, he declines colonoscopy and Cologuard despite recommendations.   Discussed the  importance of a healthy diet and regular exercise in order for weight loss, and to reduce the risk of further co-morbidity.  Exam stable. Labs pending.  Follow up in 1 year for repeat physical.    Essential hypertension Assessment & Plan: Controlled.   Continue losartan -hydrochlorothiazide  100-25 mg daily. CMP pending.   Orders: -     Lipid panel -     Comprehensive metabolic panel with GFR -     CBC  Moderate obstructive sleep apnea Assessment & Plan: Continue CPAP machine.   Hypothyroidism, unspecified type Assessment & Plan: He is taking levothyroxine  correctly.  Continue levothyroxine  112 mcg daily. TSH pending.  Orders: -     TSH  Chronic gout without tophus, unspecified cause, unspecified site Assessment & Plan: No recent flares.  Continue allopurinol  400 mg daily per Rheumatology.   Difficulty concentrating Assessment & Plan: Controlled!  Continue Strattera 60 mg daily per psychiatry.    Prediabetes Assessment & Plan: Commended him on weight loss!  Repeat A1C pending.  Orders: -     Hemoglobin A1c  Chronic midline thoracic back pain Assessment & Plan: Controlled.  Following with orthopedics.         Adlee Paar K Genevia Bouldin, NP

## 2023-10-18 NOTE — Assessment & Plan Note (Signed)
 Controlled!  Continue Strattera 60 mg daily per psychiatry.

## 2023-10-18 NOTE — Patient Instructions (Signed)
 Stop by the lab prior to leaving today. I will notify you of your results once received.   Please think about the colonoscopy and Cologuard for colon cancer screening.  It was a pleasure to see you today!

## 2023-10-18 NOTE — Assessment & Plan Note (Signed)
Commended him on weight loss! ?Repeat A1C pending. ?

## 2023-10-18 NOTE — Assessment & Plan Note (Signed)
 Controlled.  Continue losartan-hydrochlorothiazide 100-25 mg daily. CMP pending.

## 2023-10-18 NOTE — Assessment & Plan Note (Signed)
 No recent flares.  Continue allopurinol  400 mg daily per Rheumatology.

## 2023-10-18 NOTE — Assessment & Plan Note (Signed)
 He is taking levothyroxine  correctly.  Continue levothyroxine  112 mcg daily. TSH pending.

## 2023-10-18 NOTE — Assessment & Plan Note (Signed)
 Immunizations UTD. Colonoscopy overdue, he declines colonoscopy and Cologuard despite recommendations.   Discussed the importance of a healthy diet and regular exercise in order for weight loss, and to reduce the risk of further co-morbidity.  Exam stable. Labs pending.  Follow up in 1 year for repeat physical.

## 2023-10-18 NOTE — Assessment & Plan Note (Signed)
Continue CPAP machine.

## 2023-10-18 NOTE — Assessment & Plan Note (Signed)
 Controlled.  Following with orthopedics.

## 2023-11-21 DIAGNOSIS — M5416 Radiculopathy, lumbar region: Secondary | ICD-10-CM | POA: Diagnosis not present

## 2023-12-07 DIAGNOSIS — M5416 Radiculopathy, lumbar region: Secondary | ICD-10-CM | POA: Diagnosis not present

## 2023-12-11 ENCOUNTER — Other Ambulatory Visit: Payer: Self-pay | Admitting: Primary Care

## 2023-12-11 DIAGNOSIS — E039 Hypothyroidism, unspecified: Secondary | ICD-10-CM

## 2023-12-13 DIAGNOSIS — F419 Anxiety disorder, unspecified: Secondary | ICD-10-CM | POA: Diagnosis not present

## 2023-12-13 DIAGNOSIS — F909 Attention-deficit hyperactivity disorder, unspecified type: Secondary | ICD-10-CM | POA: Diagnosis not present

## 2024-01-09 ENCOUNTER — Other Ambulatory Visit: Payer: Self-pay | Admitting: Primary Care

## 2024-01-09 DIAGNOSIS — I10 Essential (primary) hypertension: Secondary | ICD-10-CM

## 2024-02-20 DIAGNOSIS — M546 Pain in thoracic spine: Secondary | ICD-10-CM | POA: Diagnosis not present

## 2024-02-20 DIAGNOSIS — M5416 Radiculopathy, lumbar region: Secondary | ICD-10-CM | POA: Diagnosis not present

## 2024-03-07 ENCOUNTER — Ambulatory Visit: Admitting: Primary Care

## 2024-03-13 DIAGNOSIS — F909 Attention-deficit hyperactivity disorder, unspecified type: Secondary | ICD-10-CM | POA: Diagnosis not present

## 2024-03-13 DIAGNOSIS — F419 Anxiety disorder, unspecified: Secondary | ICD-10-CM | POA: Diagnosis not present

## 2024-03-20 ENCOUNTER — Ambulatory Visit: Admitting: Primary Care

## 2024-03-20 ENCOUNTER — Encounter: Payer: Self-pay | Admitting: Primary Care

## 2024-03-20 VITALS — BP 138/86 | HR 94 | Temp 98.1°F | Ht 66.0 in | Wt 208.2 lb

## 2024-03-20 DIAGNOSIS — I10 Essential (primary) hypertension: Secondary | ICD-10-CM | POA: Diagnosis not present

## 2024-03-20 MED ORDER — LOSARTAN POTASSIUM 50 MG PO TABS: 50.0000 mg | ORAL_TABLET | Freq: Every day | ORAL | 1 refills | Status: AC

## 2024-03-20 NOTE — Assessment & Plan Note (Signed)
 Slightly elevated today off treatment.  Stop losartan -hydrochlorothiazide  100-25 mg daily. Start losartan  50 mg daily.  He will continue to monitor readings and symptoms.  He will update if he continues to experience the symptoms and/or if readings become too low.

## 2024-03-20 NOTE — Progress Notes (Signed)
 "  Subjective:    Patient ID: Peter Fitzgerald, male    DOB: [birth year], 48 y.o.   MRN: [id number]  Peter Fitzgerald is a very pleasant 48 y.o. male with a history of hypertension, hypothyroidism, OSA, prediabetes, chronic gout who presents today to discuss hypertension.  Currently managed on losartan  -hydrochlorothiazide   100-25 milligrams daily for hypertension. Since his last visit he's taking his medication every 2-3 days because of symptoms of dizziness and cold intolerance. He's not had his medication for three days. He is checking his BP at home which runs 130/80s.   He has lost nearly 100 pounds over the last year since initiation of Strattera. Follows with psychiatry and is feeling well managed. He is eating less sweets, making better food choices, eating lower portion sizes.    Wt Readings from Last 3 Encounters:  03/20/24 208 lb 4 oz (94.5 kg)  10/18/23 240 lb (108.9 kg)  06/06/23 284 lb (128.8 kg)     BP Readings from Last 3 Encounters:  03/20/24 138/86  10/18/23 122/80  06/06/23 110/84      Review of Systems  Respiratory:  Negative for shortness of breath.   Cardiovascular:  Negative for chest pain.  Neurological:  Positive for dizziness. Negative for headaches.         Past Medical History:  Diagnosis Date   Allergy    Arthritis    Chickenpox    Frequent headaches    Gout    Hypertension    Migraines    Thyroid  disease     Social History   Socioeconomic History   Marital status: Married    Spouse name: Not on file   Number of children: Not on file   Years of education: Not on file   Highest education level: Associate degree: occupational, scientist, product/process development, or vocational program  Occupational History   Not on file  Tobacco Use   Smoking status: Never   Smokeless tobacco: Never  Vaping Use   Vaping status: Never Used  Substance and Sexual Activity   Alcohol use: Not Currently   Drug use: Never   Sexual activity: Not on file  Other Topics Concern   Not on file   Social History Narrative   Married.   One child.   Works as a engineer, production.   Social Drivers of Health   Tobacco Use: Low Risk (03/20/2024)   Patient History    Smoking Tobacco Use: Never    Smokeless Tobacco Use: Never    Passive Exposure: Not on file  Financial Resource Strain: Low Risk (10/17/2023)   Overall Financial Resource Strain (CARDIA)    Difficulty of Paying Living Expenses: Not hard at all  Food Insecurity: No Food Insecurity (10/17/2023)   Epic    Worried About Programme Researcher, Broadcasting/film/video in the Last Year: Never true    Ran Out of Food in the Last Year: Never true  Transportation Needs: No Transportation Needs (10/17/2023)   Epic    Lack of Transportation (Medical): No    Lack of Transportation (Non-Medical): No  Physical Activity: Unknown (10/17/2023)   Exercise Vital Sign    Days of Exercise per Week: Patient declined    Minutes of Exercise per Session: Not on file  Stress: Stress Concern Present (10/17/2023)   [employer] of Occupational [employer] - Occupational Stress Questionnaire    Feeling of Stress: To some extent  Social Connections: Socially Isolated (10/17/2023)   Social Connection and Isolation Panel    Frequency of Communication with Friends  and Family: Never    Frequency of Social Gatherings with Friends and Family: Never    Attends Religious Services: Never    Database Administrator or Organizations: No    Attends Engineer, Structural: Not on file    Marital Status: Married  Catering Manager Violence: Not on file  Depression (PHQ2-9): Medium Risk (03/20/2024)   Depression (PHQ2-9)    PHQ-2 Score: 7  Alcohol Screen: Low Risk (01/25/2023)   Alcohol Screen    Last Alcohol Screening Score (AUDIT): 1  Housing: Low Risk (10/17/2023)   Epic    Unable to Pay for Housing in the Last Year: No    Number of Times Moved in the Last Year: 0    Homeless in the Last Year: No  Utilities: Not on file  Health Literacy: Not on file    Past Surgical History:   Procedure Laterality Date   LAMINECTOMY AND MICRODISCECTOMY LUMBAR SPINE  12/2018   MINOR CARPAL TUNNEL  1997    Family History  Problem Relation Age of Onset   Alcohol abuse Mother    Arthritis Mother    COPD Mother    Drug abuse Mother    Early death Mother        untreated COPD, CKD   Kidney disease Mother        On dialysis    Learning disabilities Mother    Alcohol abuse Father    Drug abuse Father    Early death Father        Drowning    Allergies[1]  Medications Ordered Prior to Encounter[2]  BP 138/86   Pulse 94   Temp 98.1 F (36.7 C) (Oral)   Ht 5' 6 (1.676 m)   Wt 208 lb 4 oz (94.5 kg)   SpO2 98%   BMI 33.61 kg/m  Objective:   Physical Exam Cardiovascular:     Rate and Rhythm: Normal rate and regular rhythm.  Pulmonary:     Effort: Pulmonary effort is normal.     Breath sounds: Normal breath sounds.  Musculoskeletal:     Cervical back: Neck supple.  Skin:    General: Skin is warm and dry.  Neurological:     Mental Status: He is alert and oriented to person, place, and time.  Psychiatric:        Mood and Affect: Mood normal.     Physical Exam        Assessment & Plan:  Essential hypertension Assessment & Plan: Slightly elevated today off treatment.  Stop losartan -hydrochlorothiazide  100-25 mg daily. Start losartan  50 mg daily.  He will continue to monitor readings and symptoms.  He will update if he continues to experience the symptoms and/or if readings become too low.    Orders: -     Losartan  Potassium; Take 1 tablet (50 mg total) by mouth daily. for blood pressure.  Dispense: 90 tablet; Refill: 1    Assessment and Plan Assessment & Plan         Comer MARLA Gaskins, NP       [1]  Allergies Allergen Reactions   Colchicine Other (See Comments)  [2]  Current Outpatient Medications on File Prior to Visit  Medication Sig Dispense Refill   atomoxetine (STRATTERA) 40 MG capsule Take 40 mg by mouth daily.      allopurinol  (ZYLOPRIM ) 300 MG tablet      atomoxetine (STRATTERA) 60 MG capsule Take 60 mg by mouth daily.     cetirizine  (ZYRTEC ) 10  MG tablet Take 10 mg by mouth daily.     fluticasone  (FLONASE ) 50 MCG/ACT nasal spray Place 2 sprays into both nostrils daily. 16 g 0   levothyroxine  (SYNTHROID ) 112 MCG tablet TAKE 1 TAB EVERY MORNING ON AN EMPTY STOMACH WITH WATER ONLY NO FOOD OR OTHER MEDICATIONS FOR 90 tablet 2   No current facility-administered medications on file prior to visit.   "

## 2024-03-20 NOTE — Patient Instructions (Signed)
 Stop taking losartan -hydrochlorothiazide  for blood pressure.  Start taking losartan  50 mg daily for blood pressure.  Schedule your physical for late July 2026.  It was a pleasure to see you today!

## 2024-10-23 ENCOUNTER — Encounter: Admitting: Primary Care
